# Patient Record
Sex: Male | Born: 1960 | Race: White | Hispanic: No | Marital: Married | Smoking: Never smoker
Health system: Southern US, Community
[De-identification: ages and names within clinical notes are randomized; demographics above are authoritative.]

## PROBLEM LIST (undated history)

## (undated) DIAGNOSIS — I10 Essential (primary) hypertension: Secondary | ICD-10-CM

## (undated) DIAGNOSIS — L409 Psoriasis, unspecified: Secondary | ICD-10-CM

## (undated) DIAGNOSIS — K5790 Diverticulosis of intestine, part unspecified, without perforation or abscess without bleeding: Secondary | ICD-10-CM

## (undated) DIAGNOSIS — E785 Hyperlipidemia, unspecified: Secondary | ICD-10-CM

## (undated) DIAGNOSIS — I350 Nonrheumatic aortic (valve) stenosis: Secondary | ICD-10-CM

## (undated) DIAGNOSIS — Q231 Congenital insufficiency of aortic valve: Secondary | ICD-10-CM

## (undated) DIAGNOSIS — I7789 Other specified disorders of arteries and arterioles: Secondary | ICD-10-CM

## (undated) DIAGNOSIS — D649 Anemia, unspecified: Secondary | ICD-10-CM

## (undated) DIAGNOSIS — G4733 Obstructive sleep apnea (adult) (pediatric): Secondary | ICD-10-CM

## (undated) DIAGNOSIS — J45909 Unspecified asthma, uncomplicated: Secondary | ICD-10-CM

## (undated) DIAGNOSIS — G4730 Sleep apnea, unspecified: Secondary | ICD-10-CM

## (undated) HISTORY — DX: Unspecified asthma, uncomplicated: J45.909

## (undated) HISTORY — DX: Psoriasis, unspecified: L40.9

## (undated) HISTORY — DX: Obstructive sleep apnea (adult) (pediatric): G47.33

## (undated) HISTORY — DX: Congenital insufficiency of aortic valve: Q23.1

## (undated) HISTORY — DX: Sleep apnea, unspecified: G47.30

## (undated) HISTORY — PX: LITHOTRIPSY: SUR834

## (undated) HISTORY — DX: Other specified disorders of arteries and arterioles: I77.89

## (undated) HISTORY — DX: Anemia, unspecified: D64.9

## (undated) HISTORY — DX: Nonrheumatic aortic (valve) stenosis: I35.0

## (undated) HISTORY — PX: HERNIA REPAIR: SHX51

## (undated) HISTORY — DX: Diverticulosis of intestine, part unspecified, without perforation or abscess without bleeding: K57.90

## (undated) HISTORY — DX: Hyperlipidemia, unspecified: E78.5

## (undated) HISTORY — DX: Essential (primary) hypertension: I10

---

## 2003-08-27 HISTORY — PX: THUMB FUSION: SUR636

## 2004-02-10 ENCOUNTER — Ambulatory Visit (HOSPITAL_BASED_OUTPATIENT_CLINIC_OR_DEPARTMENT_OTHER): Admission: RE | Admit: 2004-02-10 | Discharge: 2004-02-10 | Payer: Self-pay | Admitting: Orthopedic Surgery

## 2004-02-10 ENCOUNTER — Ambulatory Visit (HOSPITAL_COMMUNITY): Admission: RE | Admit: 2004-02-10 | Discharge: 2004-02-10 | Payer: Self-pay | Admitting: Orthopedic Surgery

## 2006-08-26 HISTORY — PX: COLONOSCOPY: SHX174

## 2016-04-06 DIAGNOSIS — I10 Essential (primary) hypertension: Secondary | ICD-10-CM

## 2016-04-06 DIAGNOSIS — Q231 Congenital insufficiency of aortic valve: Secondary | ICD-10-CM

## 2016-04-06 DIAGNOSIS — E785 Hyperlipidemia, unspecified: Secondary | ICD-10-CM

## 2016-04-06 DIAGNOSIS — Q2381 Bicuspid aortic valve: Secondary | ICD-10-CM

## 2016-04-06 DIAGNOSIS — I7789 Other specified disorders of arteries and arterioles: Secondary | ICD-10-CM

## 2016-04-06 DIAGNOSIS — I350 Nonrheumatic aortic (valve) stenosis: Secondary | ICD-10-CM | POA: Insufficient documentation

## 2016-04-06 HISTORY — DX: Nonrheumatic aortic (valve) stenosis: I35.0

## 2016-04-06 HISTORY — DX: Bicuspid aortic valve: Q23.81

## 2016-04-06 HISTORY — DX: Other specified disorders of arteries and arterioles: I77.89

## 2016-04-06 HISTORY — DX: Congenital insufficiency of aortic valve: Q23.1

## 2016-04-06 HISTORY — DX: Essential (primary) hypertension: I10

## 2016-04-06 HISTORY — DX: Hyperlipidemia, unspecified: E78.5

## 2016-08-26 HISTORY — PX: AORTIC VALVE SURGERY: SHX549

## 2016-08-26 HISTORY — PX: UPPER GASTROINTESTINAL ENDOSCOPY: SHX188

## 2017-04-03 DIAGNOSIS — G4733 Obstructive sleep apnea (adult) (pediatric): Secondary | ICD-10-CM

## 2017-04-03 DIAGNOSIS — L409 Psoriasis, unspecified: Secondary | ICD-10-CM

## 2017-04-03 DIAGNOSIS — D649 Anemia, unspecified: Secondary | ICD-10-CM

## 2017-04-03 DIAGNOSIS — K5790 Diverticulosis of intestine, part unspecified, without perforation or abscess without bleeding: Secondary | ICD-10-CM

## 2017-04-03 DIAGNOSIS — J45909 Unspecified asthma, uncomplicated: Secondary | ICD-10-CM

## 2017-04-03 HISTORY — DX: Obstructive sleep apnea (adult) (pediatric): G47.33

## 2017-04-03 HISTORY — DX: Unspecified asthma, uncomplicated: J45.909

## 2017-04-03 HISTORY — DX: Diverticulosis of intestine, part unspecified, without perforation or abscess without bleeding: K57.90

## 2017-04-03 HISTORY — DX: Psoriasis, unspecified: L40.9

## 2017-04-03 HISTORY — DX: Anemia, unspecified: D64.9

## 2017-04-07 ENCOUNTER — Ambulatory Visit (INDEPENDENT_AMBULATORY_CARE_PROVIDER_SITE_OTHER): Payer: 59 | Admitting: Cardiology

## 2017-04-07 ENCOUNTER — Encounter: Payer: Self-pay | Admitting: Cardiology

## 2017-04-07 VITALS — BP 140/98 | HR 91 | Ht 73.5 in | Wt 271.0 lb

## 2017-04-07 DIAGNOSIS — Q231 Congenital insufficiency of aortic valve: Secondary | ICD-10-CM | POA: Diagnosis not present

## 2017-04-07 DIAGNOSIS — R079 Chest pain, unspecified: Secondary | ICD-10-CM

## 2017-04-07 DIAGNOSIS — I10 Essential (primary) hypertension: Secondary | ICD-10-CM

## 2017-04-07 DIAGNOSIS — I7789 Other specified disorders of arteries and arterioles: Secondary | ICD-10-CM | POA: Diagnosis not present

## 2017-04-07 MED ORDER — LOSARTAN POTASSIUM 100 MG PO TABS: 100.0000 mg | ORAL_TABLET | Freq: Every day | ORAL | 3 refills | Status: DC

## 2017-04-07 NOTE — Patient Instructions (Signed)
Medication Instructions:  Your physician has recommended you make the following change in your medication:  STOP valsartan START losartan (Cozaar) 100 mg daily   Labwork: None  Testing/Procedures: Your physician has requested that you have an echocardiogram. Echocardiography is a painless test that uses sound waves to create images of your heart. It provides your doctor with information about the size and shape of your heart and how well your heart's chambers and valves are working. This procedure takes approximately one hour. There are no restrictions for this procedure.  Your physician has requested that you have a stress echocardiogram. For further information please visit [email or web address]. Please follow instruction sheet as given.  You had an EKG today.  Follow-Up: Your physician wants you to follow-up in: 6 months. You will receive a reminder letter in the mail two months in advance. If you don't receive a letter, please call our office to schedule the follow-up appointment.   Any Other Special Instructions Will Be Listed Below (If Applicable).     If you need a refill on your cardiac medications before your next appointment, please call your pharmacy.

## 2017-04-07 NOTE — Progress Notes (Signed)
Cardiology Office Note:    Date:  04/07/2017   ID:  [name], [name], [name], MRN [id number]  PCP:  [name], MD  Cardiologist:  Norman Herrlich, MD    Referring MD: [name], MD    ASSESSMENT:    1. Bicuspid aortic valve   2. Enlarged thoracic aorta (HCC)   3. Essential hypertension   4. Chest pain, unspecified type    PLAN:    In order of problems listed above:  1. Stable, symptoms raise concern for rapid progression echocardiogram ordered 2. Stable I don't think he requires a repeat CT scan this year 3. Stable resume his antihypertensives switch to a different ARB 4. Concerning, we discussed potential of invasive or noninvasive evaluation and he will have a stress echo performed in the office.   Next appointment: 6 months   Medication Adjustments/Labs and Tests Ordered: Current medicines are reviewed at length with the patient today.  Concerns regarding medicines are outlined above.  Orders Placed This Encounter  Procedures  . EKG 12-Lead  . ECHOCARDIOGRAM COMPLETE  . ECHOCARDIOGRAM STRESS TEST   Meds ordered this encounter  Medications  . losartan (COZAAR) 100 MG tablet    Sig: Take 1 tablet (100 mg total) by mouth daily. Red=uce to 1/2 for a systolic of < 105    Dispense:  90 tablet    Refill:  3    Chief Complaint  Patient presents with  . Follow-up    1 year flup appt   . Chest Pain  . Shortness of Breath  . Aortic Stenosis    History of Present Illness:    [name] is a 56 y.o. male with a hx of HTN, Valvular heart disease, with mild AS in 2015 and mild aortic root enlargement  last seen one year ago.He had echo and CT of chest one year ago. Recently he's been under increased stress at work and notices and hot humid weather when he walks outside a pill he gets tightness in the chest relieved with rest. It similar symptoms a year ago when he normal ischemia evaluation. Symptoms wax and wane occurring frequently and  recently have been improved. His home blood pressures are variable for a week he had stopped his medications but has resumed. Blood pressure and generalized been between 110 and 140 systolic. No shortness of breath or syncope. Compliance with diet, lifestyle and medications: Yes  Past Medical History:  Diagnosis Date  . Anemia 04/03/2017  . Asthma 04/03/2017  . Bicuspid aortic valve 04/06/2016  . Diverticulosis 04/03/2017  . 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 Essential hypertension 04/06/2016  . Hyperlipidemia 04/06/2016  . Mild aortic stenosis 04/06/2016   Overview:  Mild at last FU 2015  . Obstructive sleep apnea 04/03/2017  . Psoriasis 04/03/2017    Past Surgical History:  Procedure Laterality Date  . HERNIA REPAIR    . LITHOTRIPSY      Current Medications: Current Meds  Medication Sig  . albuterol (PROVENTIL HFA;VENTOLIN HFA) 108 (90 Base) MCG/ACT inhaler Inhale 2 puffs into the lungs every 4 (four) hours as needed for wheezing.  Marland Kitchen aspirin EC 81 MG tablet Take 81 mg by mouth daily.  . montelukast (SINGULAIR) 10 MG tablet Take 10 mg by mouth daily.  . ustekinumab (STELARA) 90 MG/ML SOSY injection 90 mg every 3 (three) months.  . [DISCONTINUED] valsartan (DIOVAN) 80 MG tablet Take 80 mg by mouth daily.  Allergies:   Sulfamethoxazole   Social History   Social History  . Marital status: Married    Spouse name: N/A  . Number of children: N/A  . Years of education: N/A   Social History Main Topics  . Smoking status: Never Smoker  . Smokeless tobacco: Never Used  . Alcohol use No  . Drug use: No  . Sexual activity: Not Asked   Other Topics Concern  . None   Social History Narrative  . None     Family History: The patient's family history includes Cancer in his sister; Diabetes in his father. ROS:   Please see the history of present illness.    All other systems reviewed and are negative.  EKGs/Labs/Other Studies Reviewed:     The following studies were reviewed today:  EKG:  EKG ordered today.  The ekg ordered today demonstrates Sinus rhythm normal Echo 05/02/17: Moderate AS 40/22 mm Hg CTA stable thoracic aorta 43 mm Exercise MPI 05/10/16 Normal at 7 mets1q Recent Labs: No results found for requested labs within last 8760 hours.  Recent Lipid Panel No results found for: CHOL, TRIG, HDL, CHOLHDL, VLDL, LDLCALC, LDLDIRECT  Physical Exam:    VS:  BP (!) 140/98 (BP Location: Left Arm, Patient Position: Sitting)   Pulse 91   Ht 6' 1.5" (1.867 m)   Wt 271 lb (122.9 kg)   SpO2 95%   BMI 35.27 kg/m     Wt Readings from Last 3 Encounters:  04/07/17 271 lb (122.9 kg)     GEN:  Well nourished, well developed in no acute distress HEENT: Normal NECK: No JVD; No carotid bruits LYMPHATICS: No lymphadenopathy CARDIAC: RRR, no murmurs, rubs, gallops RESPIRATORY:  Clear to auscultation without rales, wheezing or rhonchi  ABDOMEN: Soft, non-tender, non-distended MUSCULOSKELETAL:  No edema; No deformity  SKIN: Warm and dry NEUROLOGIC:  Alert and oriented x 3 PSYCHIATRIC:  Normal affect    Signed, Norman HerrlichBrian Munley, MD  04/07/2017 11:54 AM    [facility]

## 2017-04-17 ENCOUNTER — Telehealth: Payer: Self-pay | Admitting: Cardiology

## 2017-04-17 DIAGNOSIS — R079 Chest pain, unspecified: Secondary | ICD-10-CM | POA: Diagnosis not present

## 2017-04-17 NOTE — Telephone Encounter (Signed)
Patient wants you to call.

## 2017-04-17 NOTE — Telephone Encounter (Signed)
Scheduled pt appt for Tuesday but want BJM to give him a "quick buzz" when he gets a chance about the progression of his aortic valve

## 2017-04-22 ENCOUNTER — Ambulatory Visit (INDEPENDENT_AMBULATORY_CARE_PROVIDER_SITE_OTHER): Payer: 59 | Admitting: Cardiology

## 2017-04-22 ENCOUNTER — Ambulatory Visit (HOSPITAL_BASED_OUTPATIENT_CLINIC_OR_DEPARTMENT_OTHER)
Admission: RE | Admit: 2017-04-22 | Discharge: 2017-04-22 | Disposition: A | Discharge status: Home / Self Care | Payer: 59 | Source: Ambulatory Visit | Attending: Cardiology | Admitting: Cardiology

## 2017-04-22 ENCOUNTER — Encounter: Payer: Self-pay | Admitting: Cardiology

## 2017-04-22 VITALS — BP 132/94 | HR 119 | Ht 73.0 in | Wt 265.4 lb

## 2017-04-22 DIAGNOSIS — I350 Nonrheumatic aortic (valve) stenosis: Secondary | ICD-10-CM | POA: Diagnosis not present

## 2017-04-22 DIAGNOSIS — Z01810 Encounter for preprocedural cardiovascular examination: Secondary | ICD-10-CM

## 2017-04-22 DIAGNOSIS — Z01818 Encounter for other preprocedural examination: Secondary | ICD-10-CM | POA: Diagnosis not present

## 2017-04-22 DIAGNOSIS — I700 Atherosclerosis of aorta: Secondary | ICD-10-CM | POA: Insufficient documentation

## 2017-04-22 NOTE — Patient Instructions (Signed)
Medication Instructions:  Your physician recommends that you continue on your current medications as directed. Please refer to the Current Medication list given to you today.   Labwork: Your physician recommends that you return for lab work in: today. CBC, CMP, pt/inr   Testing/Procedures: A chest x-ray takes a picture of the organs and structures inside the chest, including the heart, lungs, and blood vessels. This test can show several things, including, whether the heart is enlarges; whether fluid is building up in the lungs; and whether pacemaker / defibrillator leads are still in place.  Your physician has requested that you have a cardiac catheterization. Cardiac catheterization is used to diagnose and/or treat various heart conditions. Doctors may recommend this procedure for a number of different reasons. The most common reason is to evaluate chest pain. Chest pain can be a symptom of coronary artery disease (CAD), and cardiac catheterization can show whether plaque is narrowing or blocking your heart's arteries. This procedure is also used to evaluate the valves, as well as measure the blood flow and oxygen levels in different parts of your heart. For further information please visit [email or web address] Please follow instruction sheet, as given.   Follow-Up: Your physician recommends that you schedule a follow-up appointment in: 3 weeks.   Any Other Special Instructions Will Be Listed Below (If Applicable).     If you need a refill on your cardiac medications before your next appointment, please call your pharmacy.

## 2017-04-22 NOTE — Progress Notes (Signed)
Cardiology Office Note:    Date:  04/22/2017   ID:  [name], [name] May 27, 1961, MRN [id number]  PCP:  [name], MD  Cardiologist:  Norman Herrlich, MD    Referring MD: [name], MD    ASSESSMENT:    1. Nonrheumatic aortic valve stenosis    PLAN:    In order of problems listed above:  1. Plan to undergo diagnostic left and right heart catheterization and if he is significant valvular stenosis and symptomatic to consider intervention. Despite young age I would hope he be a candidate for tissue AVR. He'll continue his current medical regimen including aspirin antihypertensive. His hypertension is stable.   Next appointment: 2-3 weeks   Medication Adjustments/Labs and Tests Ordered: Current medicines are reviewed at length with the patient today.  Concerns regarding medicines are outlined above.  Orders Placed This Encounter  Procedures  . DG Chest 2 View  . Comprehensive Metabolic Panel (CMET)  . CBC with Differential/Platelet  . Protime-INR   No orders of the defined types were placed in this encounter.   Chief Complaint  Patient presents with  . Follow-up    flup after echo   . Aortic Stenosis    History of Present Illness:    [name] is a 56 y.o. male with a hx of hypertension, bicuspid AV previous mild to moderate AS and mild aoric root enlargement seen recently for exertional SOB and chest tightness.  Echocardiogram 2015 showed mild stenosis echocardiogram October 2017 showed that he had just made the transition to moderate. Echocardiogram done last week because of exertional shortness of breath and chest tightness shows moderate to severe aortic stenosis. I reviewed with the patient is wife this is a very uncommon progression he does not smoke and does not had radiation therapy. He is very apprehensive and advised him undergo both left and right heart catheterization to exclude associated CAD, he had a normal myocardial perfusion study  in October 2017 and to define his aortic stenosis and whether he needs to be considered for intervention. Options risk and benefits detailed the patient and wife agree. I've asked him to hold off on vigorous physical activity and exercise pending his coronary angiogram. He has a wedding scheduled 2 weeks and I told him I think he could attend. Compliance with diet, lifestyle and medications: Yes Past Medical History:  Diagnosis Date  . Anemia 04/03/2017  . Asthma 04/03/2017  . Bicuspid aortic valve 04/06/2016  . Diverticulosis 04/03/2017  . 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 Essential hypertension 04/06/2016  . Hyperlipidemia 04/06/2016  . Mild aortic stenosis 04/06/2016   Overview:  Mild at last FU 2015  . Obstructive sleep apnea 04/03/2017  . Psoriasis 04/03/2017    Past Surgical History:  Procedure Laterality Date  . HERNIA REPAIR    . LITHOTRIPSY      Current Medications: No outpatient prescriptions have been marked as taking for the 04/22/17 encounter (Office Visit) with Baldo Daub, MD.     Allergies:   Sulfamethoxazole   Social History   Social History  . Marital status: Married    Spouse name: N/A  . Number of children: N/A  . Years of education: N/A   Social History Main Topics  . Smoking status: Never Smoker  . Smokeless tobacco: Never Used  . Alcohol use No  . Drug use: No  . Sexual activity: Not Asked   Other Topics  Concern  . None   Social History Narrative  . None     Family History: The patient's family history includes Cancer in his sister; Diabetes in his father. ROS:   Please see the history of present illness.    All other systems reviewed and are negative.  EKGs/Labs/Other Studies Reviewed:    The following studies were reviewed today:   Echo 04/17/17:  showd worsened AS P/M 61/37 mm hg with AVA 0.8cm2 Recent Labs: No results found for requested labs within last 8760 hours.  Recent Lipid Panel No  results found for: CHOL, TRIG, HDL, CHOLHDL, VLDL, LDLCALC, LDLDIRECT  Physical Exam:    VS:  BP (!) 132/94 (BP Location: Right Arm, Patient Position: Sitting)   Pulse (!) 119   Ht 6\' 1"  (1.854 m)   Wt 265 lb 6.4 oz (120.4 kg)   SpO2 96%   BMI 35.02 kg/m     Wt Readings from Last 3 Encounters:  04/22/17 265 lb 6.4 oz (120.4 kg)  04/07/17 271 lb (122.9 kg)     GEN:  Well nourished, well developed in no acute distress HEENT: Normal NECK: No JVD; No carotid bruits LYMPHATICS: No lymphadenopathy CARDIAC: He has a grade 2 to 3/6 systolic ejection murmur peaks late S2 single radiates up into the right clavicular area RRR,  rubs, gallops RESPIRATORY:  Clear to auscultation without rales, wheezing or rhonchi  ABDOMEN: Soft, non-tender, non-distended MUSCULOSKELETAL:  No edema; No deformity  SKIN: Warm and dry NEUROLOGIC:  Alert and oriented x 3 PSYCHIATRIC:  Normal affect    Signed, Norman HerrlichBrian Munley, MD  04/22/2017 11:03 AM    [facility]

## 2017-04-23 ENCOUNTER — Telehealth: Payer: Self-pay

## 2017-04-23 LAB — COMPREHENSIVE METABOLIC PANEL
ALBUMIN: 4.8 g/dL (ref 3.5–5.5)
ALK PHOS: 51 IU/L (ref 39–117)
ALT: 33 IU/L (ref 0–44)
AST: 23 IU/L (ref 0–40)
Albumin/Globulin Ratio: 1.7 (ref 1.2–2.2)
BUN / CREAT RATIO: 18 (ref 9–20)
BUN: 19 mg/dL (ref 6–24)
Bilirubin Total: 0.7 mg/dL (ref 0.0–1.2)
CALCIUM: 10.2 mg/dL (ref 8.7–10.2)
CO2: 20 mmol/L (ref 20–29)
CREATININE: 1.04 mg/dL (ref 0.76–1.27)
Chloride: 104 mmol/L (ref 96–106)
GFR calc non Af Amer: 81 mL/min/{1.73_m2} (ref >59)
GFR, EST AFRICAN AMERICAN: 94 mL/min/{1.73_m2} (ref >59)
GLUCOSE: 81 mg/dL (ref 65–99)
Globulin, Total: 2.8 g/dL (ref 1.5–4.5)
Potassium: 4.3 mmol/L (ref 3.5–5.2)
Sodium: 139 mmol/L (ref 134–144)
TOTAL PROTEIN: 7.6 g/dL (ref 6.0–8.5)

## 2017-04-23 LAB — CBC WITH DIFFERENTIAL/PLATELET
Basophils Absolute: 0 10*3/uL (ref 0.0–0.2)
Basos: 1 %
EOS (ABSOLUTE): 0.1 10*3/uL (ref 0.0–0.4)
EOS: 1 %
HEMATOCRIT: 48.1 % (ref 37.5–51.0)
HEMOGLOBIN: 17.4 g/dL (ref 13.0–17.7)
Immature Grans (Abs): 0 10*3/uL (ref 0.0–0.1)
Immature Granulocytes: 0 %
LYMPHS ABS: 1.5 10*3/uL (ref 0.7–3.1)
Lymphs: 22 %
MCH: 33.4 pg — ABNORMAL HIGH (ref 26.6–33.0)
MCHC: 36.2 g/dL — AB (ref 31.5–35.7)
MCV: 92 fL (ref 79–97)
MONOCYTES: 14 %
Monocytes Absolute: 0.9 10*3/uL (ref 0.1–0.9)
Neutrophils Absolute: 4.1 10*3/uL (ref 1.4–7.0)
Neutrophils: 62 %
Platelets: 214 10*3/uL (ref 150–379)
RBC: 5.21 x10E6/uL (ref 4.14–5.80)
RDW: 14.6 % (ref 12.3–15.4)
WBC: 6.6 10*3/uL (ref 3.4–10.8)

## 2017-04-23 LAB — PROTIME-INR
INR: 1.1 (ref 0.8–1.2)
PROTHROMBIN TIME: 11.1 s (ref 9.1–12.0)

## 2017-04-23 NOTE — Telephone Encounter (Signed)
[facility] for:  04/24/2017 @ 0730 Verified arrival time and place:  NT @ 0530 Confirmed AM meds to be taken pre-cath with sip of water:   Take ASA Confirmed patient has responsible person to drive home post procedure and observe patient for 24 hours:  Yes Addl concerns:  All questions answered to Pt satisfaction.

## 2017-04-24 ENCOUNTER — Encounter (HOSPITAL_COMMUNITY)
Admission: RE | Disposition: A | Discharge status: Home / Self Care | Payer: Self-pay | Source: Ambulatory Visit | Attending: Interventional Cardiology

## 2017-04-24 ENCOUNTER — Ambulatory Visit (HOSPITAL_COMMUNITY)
Admission: RE | Admit: 2017-04-24 | Discharge: 2017-04-24 | Disposition: A | Discharge status: Home / Self Care | Payer: 59 | Source: Ambulatory Visit | Attending: Interventional Cardiology | Admitting: Interventional Cardiology

## 2017-04-24 ENCOUNTER — Encounter (HOSPITAL_COMMUNITY): Payer: Self-pay | Admitting: Interventional Cardiology

## 2017-04-24 DIAGNOSIS — I352 Nonrheumatic aortic (valve) stenosis with insufficiency: Secondary | ICD-10-CM | POA: Diagnosis not present

## 2017-04-24 DIAGNOSIS — D649 Anemia, unspecified: Secondary | ICD-10-CM | POA: Diagnosis present

## 2017-04-24 DIAGNOSIS — Q231 Congenital insufficiency of aortic valve: Secondary | ICD-10-CM | POA: Diagnosis present

## 2017-04-24 DIAGNOSIS — I7789 Other specified disorders of arteries and arterioles: Secondary | ICD-10-CM | POA: Diagnosis present

## 2017-04-24 DIAGNOSIS — G4733 Obstructive sleep apnea (adult) (pediatric): Secondary | ICD-10-CM | POA: Insufficient documentation

## 2017-04-24 DIAGNOSIS — I10 Essential (primary) hypertension: Secondary | ICD-10-CM | POA: Diagnosis present

## 2017-04-24 DIAGNOSIS — J45909 Unspecified asthma, uncomplicated: Secondary | ICD-10-CM | POA: Insufficient documentation

## 2017-04-24 DIAGNOSIS — E785 Hyperlipidemia, unspecified: Secondary | ICD-10-CM | POA: Diagnosis present

## 2017-04-24 DIAGNOSIS — I2510 Atherosclerotic heart disease of native coronary artery without angina pectoris: Secondary | ICD-10-CM

## 2017-04-24 DIAGNOSIS — I350 Nonrheumatic aortic (valve) stenosis: Secondary | ICD-10-CM | POA: Diagnosis not present

## 2017-04-24 DIAGNOSIS — Q2381 Bicuspid aortic valve: Secondary | ICD-10-CM

## 2017-04-24 HISTORY — PX: RIGHT/LEFT HEART CATH AND CORONARY ANGIOGRAPHY: CATH118266

## 2017-04-24 LAB — POCT I-STAT 3, ART BLOOD GAS (G3+)
BICARBONATE: 25.2 mmol/L (ref 20.0–28.0)
O2 Saturation: 95 %
PH ART: 7.404 (ref 7.350–7.450)
TCO2: 26 mmol/L (ref 22–32)
pCO2 arterial: 40.2 mmHg (ref 32.0–48.0)
pO2, Arterial: 74 mmHg — ABNORMAL LOW (ref 83.0–108.0)

## 2017-04-24 LAB — POCT I-STAT 3, VENOUS BLOOD GAS (G3P V)
Acid-Base Excess: 1 mmol/L (ref 0.0–2.0)
Bicarbonate: 26.1 mmol/L (ref 20.0–28.0)
O2 SAT: 68 %
PCO2 VEN: 43.8 mmHg — AB (ref 44.0–60.0)
PO2 VEN: 36 mmHg (ref 32.0–45.0)
TCO2: 27 mmol/L (ref 22–32)
pH, Ven: 7.383 (ref 7.250–7.430)

## 2017-04-24 SURGERY — RIGHT/LEFT HEART CATH AND CORONARY ANGIOGRAPHY
Anesthesia: LOCAL

## 2017-04-24 MED ORDER — FENTANYL CITRATE (PF) 100 MCG/2ML IJ SOLN
INTRAMUSCULAR | Status: DC | PRN
  Administered 2017-04-24 (×2): 50 ug via INTRAVENOUS

## 2017-04-24 MED ORDER — HEPARIN (PORCINE) IN NACL 2-0.9 UNIT/ML-% IJ SOLN
INTRAMUSCULAR | Status: AC | PRN
  Administered 2017-04-24: 1000 mL

## 2017-04-24 MED ORDER — ONDANSETRON HCL 4 MG/2ML IJ SOLN: 4.0000 mg | Freq: Four times a day (QID) | INTRAMUSCULAR | Status: DC | PRN

## 2017-04-24 MED ORDER — LIDOCAINE HCL 2 % IJ SOLN
INTRAMUSCULAR | Status: AC
  Filled 2017-04-24: qty 10

## 2017-04-24 MED ORDER — ASPIRIN 81 MG PO CHEW: 81.0000 mg | CHEWABLE_TABLET | ORAL | Status: AC

## 2017-04-24 MED ORDER — VERAPAMIL HCL 2.5 MG/ML IV SOLN
INTRAVENOUS | Status: AC
Start: 2017-04-24 — End: 2017-04-24
  Filled 2017-04-24: qty 2

## 2017-04-24 MED ORDER — SODIUM CHLORIDE 0.9 % WEIGHT BASED INFUSION: 1.0000 mL/kg/h | INTRAVENOUS | Status: DC

## 2017-04-24 MED ORDER — SODIUM CHLORIDE 0.9 % IV SOLN: INTRAVENOUS | Status: DC

## 2017-04-24 MED ORDER — LIDOCAINE HCL (PF) 1 % IJ SOLN
INTRAMUSCULAR | Status: DC | PRN
  Administered 2017-04-24 (×2): 1 mL via INTRADERMAL

## 2017-04-24 MED ORDER — OXYCODONE HCL 5 MG PO TABS: 5.0000 mg | ORAL_TABLET | ORAL | Status: DC | PRN

## 2017-04-24 MED ORDER — MIDAZOLAM HCL 2 MG/2ML IJ SOLN
INTRAMUSCULAR | Status: DC | PRN
  Administered 2017-04-24 (×3): 1 mg via INTRAVENOUS

## 2017-04-24 MED ORDER — SODIUM CHLORIDE 0.9 % IV SOLN: 250.0000 mL | INTRAVENOUS | Status: DC | PRN

## 2017-04-24 MED ORDER — HEPARIN SODIUM (PORCINE) 1000 UNIT/ML IJ SOLN
INTRAMUSCULAR | Status: DC | PRN
  Administered 2017-04-24: 6000 [IU] via INTRAVENOUS

## 2017-04-24 MED ORDER — IOPAMIDOL (ISOVUE-370) INJECTION 76%
INTRAVENOUS | Status: DC | PRN
  Administered 2017-04-24: 75 mL via INTRA_ARTERIAL

## 2017-04-24 MED ORDER — ACETAMINOPHEN 325 MG PO TABS: 650.0000 mg | ORAL_TABLET | ORAL | Status: DC | PRN

## 2017-04-24 MED ORDER — ASPIRIN 81 MG PO CHEW: 81.0000 mg | CHEWABLE_TABLET | Freq: Every day | ORAL | Status: DC

## 2017-04-24 MED ORDER — HEPARIN SODIUM (PORCINE) 1000 UNIT/ML IJ SOLN
INTRAMUSCULAR | Status: AC
  Filled 2017-04-24: qty 1

## 2017-04-24 MED ORDER — MIDAZOLAM HCL 2 MG/2ML IJ SOLN
INTRAMUSCULAR | Status: AC
  Filled 2017-04-24: qty 2

## 2017-04-24 MED ORDER — SODIUM CHLORIDE 0.9 % WEIGHT BASED INFUSION
3.0000 mL/kg/h | INTRAVENOUS | Status: AC
  Administered 2017-04-24: 3 mL/kg/h via INTRAVENOUS

## 2017-04-24 MED ORDER — IOPAMIDOL (ISOVUE-370) INJECTION 76%
INTRAVENOUS | Status: AC
  Filled 2017-04-24: qty 100

## 2017-04-24 MED ORDER — HEPARIN (PORCINE) IN NACL 2-0.9 UNIT/ML-% IJ SOLN
INTRAMUSCULAR | Status: AC
  Filled 2017-04-24: qty 1000

## 2017-04-24 MED ORDER — SODIUM CHLORIDE 0.9% FLUSH: 3.0000 mL | Freq: Two times a day (BID) | INTRAVENOUS | Status: DC

## 2017-04-24 MED ORDER — FENTANYL CITRATE (PF) 100 MCG/2ML IJ SOLN
INTRAMUSCULAR | Status: AC
  Filled 2017-04-24: qty 2

## 2017-04-24 MED ORDER — SODIUM CHLORIDE 0.9% FLUSH: 3.0000 mL | INTRAVENOUS | Status: DC | PRN

## 2017-04-24 MED ORDER — VERAPAMIL HCL 2.5 MG/ML IV SOLN
INTRAVENOUS | Status: DC | PRN
  Administered 2017-04-24: 10 mL via INTRA_ARTERIAL

## 2017-04-24 SURGICAL SUPPLY — 16 items
CATH BALLN WEDGE 5F 110CM (CATHETERS) ×2 IMPLANT
CATH EXPO 5FR FR4 (CATHETERS) ×2 IMPLANT
CATH INFINITI 5 FR JL3.5 (CATHETERS) ×2 IMPLANT
COVER PRB 48X5XTLSCP FOLD TPE (BAG) ×1 IMPLANT
COVER PROBE 5X48 (BAG) ×2
DEVICE RAD COMP TR BAND LRG (VASCULAR PRODUCTS) ×2 IMPLANT
GLIDESHEATH SLEND A-KIT 6F 22G (SHEATH) ×2 IMPLANT
GUIDEWIRE .025 260CM (WIRE) ×2 IMPLANT
GUIDEWIRE INQWIRE 1.5J.035X260 (WIRE) ×2 IMPLANT
INQWIRE 1.5J .035X260CM (WIRE) ×4
KIT HEART LEFT (KITS) ×2 IMPLANT
PACK CARDIAC CATHETERIZATION (CUSTOM PROCEDURE TRAY) ×2 IMPLANT
SHEATH GLIDE SLENDER 4/5FR (SHEATH) ×2 IMPLANT
TRANSDUCER W/STOPCOCK (MISCELLANEOUS) ×2 IMPLANT
TUBING CIL FLEX 10 FLL-RA (TUBING) ×2 IMPLANT
WIRE EMERALD ST .035X150CM (WIRE) ×2 IMPLANT

## 2017-04-24 NOTE — Discharge Instructions (Signed)

## 2017-04-24 NOTE — Progress Notes (Signed)
Site area: right radial  Site Prior to Removal:  Level 0  Pressure Applied For 10 MINUTES    Minutes Beginning at 0905  Manual:   yes  Patient Status During Pull:good  Post sheath pull: 0  Post Pull Instructions Given: Yes  Post Pull Pulses Present: yes  Dressing Applied: yes pressure dressing with Coban  Comments:  Tolerated well

## 2017-04-24 NOTE — H&P (View-Only) (Signed)
Cardiology Office Note:    Date:  04/22/2017   ID:  [name], DOB 11/08/1960, MRN [id number]  PCP:  Robbins, Robert A, MD  Cardiologist:  Brian Munley, MD    Referring MD: Robbins, Robert A, MD    ASSESSMENT:    1. Nonrheumatic aortic valve stenosis    PLAN:    In order of problems listed above:  1. Plan to undergo diagnostic left and right heart catheterization and if he is significant valvular stenosis and symptomatic to consider intervention. Despite young age I would hope he be a candidate for tissue AVR. He'll continue his current medical regimen including aspirin antihypertensive. His hypertension is stable.   Next appointment: 2-3 weeks   Medication Adjustments/Labs and Tests Ordered: Current medicines are reviewed at length with the patient today.  Concerns regarding medicines are outlined above.  Orders Placed This Encounter  Procedures  . DG Chest 2 View  . Comprehensive Metabolic Panel (CMET)  . CBC with Differential/Platelet  . Protime-INR   No orders of the defined types were placed in this encounter.   Chief Complaint  Patient presents with  . Follow-up    flup after echo   . Aortic Stenosis    History of Present Illness:    [name] is a 56 y.o. male with a hx of hypertension, bicuspid AV previous mild to moderate AS and mild aoric root enlargement seen recently for exertional SOB and chest tightness.  Echocardiogram 2015 showed mild stenosis echocardiogram October 2017 showed that he had just made the transition to moderate. Echocardiogram done last week because of exertional shortness of breath and chest tightness shows moderate to severe aortic stenosis. I reviewed with the patient is wife this is a very uncommon progression he does not smoke and does not had radiation therapy. He is very apprehensive and advised him undergo both left and right heart catheterization to exclude associated CAD, he had a normal myocardial perfusion study  in October 2017 and to define his aortic stenosis and whether he needs to be considered for intervention. Options risk and benefits detailed the patient and wife agree. I've asked him to hold off on vigorous physical activity and exercise pending his coronary angiogram. He has a wedding scheduled 2 weeks and I told him I think he could attend. Compliance with diet, lifestyle and medications: Yes Past Medical History:  Diagnosis Date  . Anemia 04/03/2017  . Asthma 04/03/2017  . Bicuspid aortic valve 04/06/2016  . Diverticulosis 04/03/2017  . 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 Essential hypertension 04/06/2016  . Hyperlipidemia 04/06/2016  . Mild aortic stenosis 04/06/2016   Overview:  Mild at last FU 2015  . Obstructive sleep apnea 04/03/2017  . Psoriasis 04/03/2017    Past Surgical History:  Procedure Laterality Date  . HERNIA REPAIR    . LITHOTRIPSY      Current Medications: No outpatient prescriptions have been marked as taking for the 04/22/17 encounter (Office Visit) with Munley, Brian J, MD.     Allergies:   Sulfamethoxazole   Social History   Social History  . Marital status: Married    Spouse name: N/A  . Number of children: N/A  . Years of education: N/A   Social History Main Topics  . Smoking status: Never Smoker  . Smokeless tobacco: Never Used  . Alcohol use No  . Drug use: No  . Sexual activity: Not Asked   Other Topics   Concern  . None   Social History Narrative  . None     Family History: The patient's family history includes Cancer in his sister; Diabetes in his father. ROS:   Please see the history of present illness.    All other systems reviewed and are negative.  EKGs/Labs/Other Studies Reviewed:    The following studies were reviewed today:   Echo 04/17/17:  showd worsened AS P/M 61/37 mm hg with AVA 0.8cm2 Recent Labs: No results found for requested labs within last 8760 hours.  Recent Lipid Panel No  results found for: CHOL, TRIG, HDL, CHOLHDL, VLDL, LDLCALC, LDLDIRECT  Physical Exam:    VS:  BP (!) 132/94 (BP Location: Right Arm, Patient Position: Sitting)   Pulse (!) 119   Ht 6\' 1"  (1.854 m)   Wt 265 lb 6.4 oz (120.4 kg)   SpO2 96%   BMI 35.02 kg/m     Wt Readings from Last 3 Encounters:  04/22/17 265 lb 6.4 oz (120.4 kg)  04/07/17 271 lb (122.9 kg)     GEN:  Well nourished, well developed in no acute distress HEENT: Normal NECK: No JVD; No carotid bruits LYMPHATICS: No lymphadenopathy CARDIAC: He has a grade 2 to 3/6 systolic ejection murmur peaks late S2 single radiates up into the right clavicular area RRR,  rubs, gallops RESPIRATORY:  Clear to auscultation without rales, wheezing or rhonchi  ABDOMEN: Soft, non-tender, non-distended MUSCULOSKELETAL:  No edema; No deformity  SKIN: Warm and dry NEUROLOGIC:  Alert and oriented x 3 PSYCHIATRIC:  Normal affect    Signed, Norman HerrlichBrian Munley, MD  04/22/2017 11:03 AM    [facility]

## 2017-04-24 NOTE — Interval H&P Note (Signed)
Cath Lab Visit (complete for each Cath Lab visit)  Clinical Evaluation Leading to the Procedure:   ACS: No.  Non-ACS:    Anginal Classification: CCS III  Anti-ischemic medical therapy: Minimal Therapy (1 class of medications)  Non-Invasive Test Results: No non-invasive testing performed  Prior CABG: No previous CABG      History and Physical Interval Note:  04/24/2017 7:16 AM  [name]  has presented today for surgery, with the diagnosis of aortic stenosis  The various methods of treatment have been discussed with the patient and family. After consideration of risks, benefits and other options for treatment, the patient has consented to  Procedure(s): RIGHT/LEFT HEART CATH AND CORONARY ANGIOGRAPHY (N/A) as a surgical intervention .  The patient's history has been reviewed, patient examined, no change in status, stable for surgery.  I have reviewed the patient's chart and labs.  Questions were answered to the patient's satisfaction.     Lyn RecordsHenry W Smith III

## 2017-05-07 ENCOUNTER — Other Ambulatory Visit (HOSPITAL_COMMUNITY): Payer: 59

## 2017-05-12 NOTE — Progress Notes (Signed)
Cardiology Office Note:    Date:  05/13/2017   ID:  [name], DOB 07/11/1961, MRN [id number]  PCP:  [name], MD  Cardiologist:  Norman Herrlich, MD    Referring MD: [name], MD    ASSESSMENT:    1. Aortic stenosis due to bicuspid aortic valve    PLAN:    In order of problems listed above:  1. He is referred to CT surgery for consultation and discussion of surgical AVR. He'll make a well visit with his dentist. He was given a prescription of Ativan to use when necessary with anxiety. With this enlargement of thoracic aorta he'll require a preoperative CT   Next appointment: after surgical procedure   Medication Adjustments/Labs and Tests Ordered: Current medicines are reviewed at length with the patient today.  Concerns regarding medicines are outlined above.  Orders Placed This Encounter  Procedures  . Ambulatory referral to Cardiovascular Surgery   Meds ordered this encounter  Medications  . LORazepam (ATIVAN) 1 MG tablet    Sig: Take 1 tablet (1 mg total) by mouth 2 (two) times daily as needed for anxiety.    Dispense:  30 tablet    Refill:  0    Chief Complaint  Patient presents with  . Follow-up    3 week flup after RHC and LHC    History of Present Illness:    [name] is a 56 y.o. male with a hx of  hypertension, bicuspid AV previous mild to moderate AS and mild aoric root enlargement  last seen 04/22/17 and recent cath confirms severe AS.he had a recent wedding of his child felt well and was vigorous and active.He is quite anxious and apprehensive about upcoming aortic valve surgery plans to see CT surgery as an outpatient and is considering a second opinion. I gave him a prescription for Ativan to use as needed for anxiety and maze referral for surgical consultation. I asked him  To schedule with his dentist. He is concerned about choice of mechanical versus tissue valve. Compliance with diet, lifestyle and medications:  yes Past Medical History:  Diagnosis Date  . Anemia 04/03/2017  . Asthma 04/03/2017  . Bicuspid aortic valve 04/06/2016  . Diverticulosis 04/03/2017  . 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 Essential hypertension 04/06/2016  . Hyperlipidemia 04/06/2016  . Mild aortic stenosis 04/06/2016   Overview:  Mild at last FU 2015  . Obstructive sleep apnea 04/03/2017  . Psoriasis 04/03/2017    Past Surgical History:  Procedure Laterality Date  . HERNIA REPAIR    . LITHOTRIPSY    . RIGHT/LEFT HEART CATH AND CORONARY ANGIOGRAPHY N/A 04/24/2017   Procedure: RIGHT/LEFT HEART CATH AND CORONARY ANGIOGRAPHY;  Surgeon: Lyn Records, MD;  Location: [facility];  Service: Cardiovascular;  Laterality: N/A;    Current Medications: Current Meds  Medication Sig  . albuterol (PROVENTIL HFA;VENTOLIN HFA) 108 (90 Base) MCG/ACT inhaler Inhale 2 puffs into the lungs every 4 (four) hours as needed for wheezing.  Marland Kitchen aspirin EC 81 MG tablet Take 81 mg by mouth daily.  . cholecalciferol (VITAMIN D) 1000 units tablet Take 2,000 Units by mouth daily.   Marland Kitchen losartan (COZAAR) 100 MG tablet Take 1 tablet (100 mg total) by mouth daily. Red=uce to 1/2 for a systolic of < 105  . montelukast (SINGULAIR) 10 MG tablet Take 10 mg by mouth daily as needed (allergies).   Marland Kitchen  Multiple Vitamin (MULTIVITAMIN WITH MINERALS) TABS tablet Take 1 tablet by mouth daily.  . ustekinumab (STELARA) 90 MG/ML SOSY injection Inject 90 mg into the skin every 3 (three) months.      Allergies:   Sulfamethoxazole   Social History   Social History  . Marital status: Married    Spouse name: N/A  . Number of children: N/A  . Years of education: N/A   Social History Main Topics  . Smoking status: Never Smoker  . Smokeless tobacco: Never Used  . Alcohol use No  . Drug use: No  . Sexual activity: Not on file   Other Topics Concern  . Not on file   Social History Narrative  . No narrative on file      Family History: The patient's family history includes Cancer in his sister; Diabetes in his father. ROS:   Please see the history of present illness.    All other systems reviewed and are negative.  EKGs/Labs/Other Studies Reviewed:    The following studies were reviewed today: Right/left heart cath: Conclusion    Moderately severe to severe aortic stenosis, peak to peak gradient 58 mmHg, mean gradient 43 mmHg. Heavily calcified aortic valve with decreased leaflet motion.  Right dominant coronary anatomy. 30-40% proximal RCA eccentric narrowing.  Normal left main .  Normal circumflex.  Normal ramus intermedius.  Normal right heart pressures.    Recent Labs: 04/22/2017: ALT 33; BUN 19; Creatinine, Ser 1.04; Hemoglobin 17.4; Platelets 214; Potassium 4.3; Sodium 139  Recent Lipid Panel No results found for: CHOL, TRIG, HDL, CHOLHDL, VLDL, LDLCALC, LDLDIRECT  Physical Exam:    VS:  BP 122/88 (BP Location: Right Arm, Patient Position: Sitting)   Pulse (!) 129   Ht  (1.854 m)   Wt 264 lb 1.9 oz (119.8 kg)   SpO2 96%   BMI 34.85 kg/m     Wt Readings from Last 3 Encounters:  05/13/17 264 lb 1.9 oz (119.8 kg)  04/24/17 265 lb (120.2 kg)  04/22/17 265 lb 6.4 oz (120.4 kg)     GEN:  Well nourished, well developed in no acute distress HEENT: Normal NECK: No JVD; No carotid bruits LYMPHATICS: No lymphadenopathy CARDIAC: 3-46 AF's to the aortic area to the right clavicle to the carotids S2 is singleRRR,  RESPIRATORY:  Clear to auscultation without rales, wheezing or rhonchi  ABDOMEN: Soft, non-tender, non-distended MUSCULOSKELETAL:  No edema; No deformity  SKIN: Warm and dry NEUROLOGIC:  Alert and oriented x 3 PSYCHIATRIC:  Normal affect    Signed, Norman Herrlich, MD  05/13/2017 4:11 PM    [facility]

## 2017-05-13 ENCOUNTER — Ambulatory Visit (INDEPENDENT_AMBULATORY_CARE_PROVIDER_SITE_OTHER): Payer: BC Managed Care – PPO | Admitting: Cardiology

## 2017-05-13 VITALS — BP 122/88 | HR 129 | Ht 73.0 in | Wt 264.1 lb

## 2017-05-13 DIAGNOSIS — Q230 Congenital stenosis of aortic valve: Secondary | ICD-10-CM

## 2017-05-13 DIAGNOSIS — Q231 Congenital insufficiency of aortic valve: Secondary | ICD-10-CM

## 2017-05-13 MED ORDER — LORAZEPAM 1 MG PO TABS: 1.0000 mg | ORAL_TABLET | Freq: Two times a day (BID) | ORAL | 0 refills | Status: DC | PRN

## 2017-05-13 NOTE — Patient Instructions (Signed)
Medication Instructions:  Your physician has recommended you make the following change in your medication:  START lorazepam (Ativan) 1 mg twice daily as needed for anxiety.  Labwork: None  Testing/Procedures: None  Follow-Up: Your physician recommends that you schedule a follow-up appointment after your surgery. Call this office to schedule.  You will receive a phone call to schedule with cardiovascular surgery, Dr. Cornelius Moras.  Any Other Special Instructions Will Be Listed Below (If Applicable).     If you need a refill on your cardiac medications before your next appointment, please call your pharmacy.

## 2017-05-14 ENCOUNTER — Telehealth: Payer: Self-pay | Admitting: Cardiology

## 2017-05-14 NOTE — Telephone Encounter (Signed)
Patient coming tomorrow to sign release.

## 2017-05-14 NOTE — Telephone Encounter (Signed)
Dr. Dulce Sellar referred patient to [facility] for 2nd opinion and [facility] needs medical records and CD of testing, please call patient.

## 2017-05-14 NOTE — Telephone Encounter (Signed)
Left message to return call regarding medical release of information needing to be signed.

## 2017-05-14 NOTE — Telephone Encounter (Signed)
Second opinion: Dr Silvestre Mesi [facility] [place] [facility]  I would see Dr Cornelius Moras first before the second one

## 2017-05-14 NOTE — Telephone Encounter (Signed)
Which [facility] physician did you recommend?

## 2017-05-15 ENCOUNTER — Other Ambulatory Visit: Payer: Self-pay

## 2017-05-15 DIAGNOSIS — Q230 Congenital stenosis of aortic valve: Secondary | ICD-10-CM

## 2017-05-15 DIAGNOSIS — Q231 Congenital insufficiency of aortic valve: Principal | ICD-10-CM

## 2017-05-15 MED ORDER — LORAZEPAM 1 MG PO TABS: 1.0000 mg | ORAL_TABLET | Freq: Two times a day (BID) | ORAL | 0 refills | Status: DC | PRN

## 2017-05-15 NOTE — Telephone Encounter (Signed)
Release signed. Faxed to medical records.

## 2017-05-27 ENCOUNTER — Telehealth: Payer: Self-pay | Admitting: Cardiology

## 2017-05-27 NOTE — Telephone Encounter (Signed)
Spoke with pt and made him aware the requests for Cath CD had been sent to Medical Records, but there is a waiting period for records to be sent.  Pt states that he will contact Medial Records for [facility] and [facility] to try and retrieve CD on his own.  Pt advised to contact our office with any other issues.  Pt verbalized understanding.

## 2017-05-27 NOTE — Telephone Encounter (Signed)
States a CD was supposed to be sent to [facility] but they have not received it

## 2017-05-29 DIAGNOSIS — I7121 Aneurysm of the ascending aorta, without rupture: Secondary | ICD-10-CM | POA: Insufficient documentation

## 2017-05-29 HISTORY — DX: Aneurysm of the ascending aorta, without rupture: I71.21

## 2017-05-30 ENCOUNTER — Telehealth: Payer: Self-pay | Admitting: Cardiology

## 2017-05-30 NOTE — Telephone Encounter (Signed)
Pt notified he can pick up copy of CATH cd today.  Pt verbalized understanding.

## 2017-05-30 NOTE — Telephone Encounter (Signed)
Wants to know if you got his disc yet so he can come by and pick it up

## 2017-06-02 ENCOUNTER — Institutional Professional Consult (permissible substitution) (INDEPENDENT_AMBULATORY_CARE_PROVIDER_SITE_OTHER): Payer: BC Managed Care – PPO | Admitting: Thoracic Surgery (Cardiothoracic Vascular Surgery)

## 2017-06-02 ENCOUNTER — Encounter: Payer: Self-pay | Admitting: Thoracic Surgery (Cardiothoracic Vascular Surgery)

## 2017-06-02 VITALS — BP 140/98 | HR 103 | Ht 73.0 in | Wt 262.0 lb

## 2017-06-02 DIAGNOSIS — I7789 Other specified disorders of arteries and arterioles: Secondary | ICD-10-CM | POA: Diagnosis not present

## 2017-06-02 DIAGNOSIS — I350 Nonrheumatic aortic (valve) stenosis: Secondary | ICD-10-CM | POA: Diagnosis not present

## 2017-06-02 NOTE — Progress Notes (Addendum)
[address]       [place] [address]             [phone]     CARDIOTHORACIC SURGERY CONSULTATION REPORT  Referring Provider is Baldo Daub, MD PCP is [name], MD  Chief Complaint  Patient presents with  . Aortic Stenosis    Cath 04/24/2017    HPI:  Patient is a 56 year old moderately obese male referred for surgical consultation to discuss treatment options for management of likely bicuspid aortic valve with severe symptomatic aortic stenosis. The patient states that he was first noted to have a heart murmur on physical exam in 2008 when he presented with a GI bleed requiring transfusion. He underwent upper and lower endoscopy at that time without finding a clear source for GI blood loss.  Transthoracic echocardiogram performed at that time demonstrated the presence of likely functionally bicuspid aortic valve with mild-to-moderate aortic stenosis. He has been followed intermittently ever since by Dr. Dulce Sellar. Over the last few months the patient has developed symptoms of exertional shortness of breath, decreased energy, and occasional chest tightness. Symptoms have progressed recently with occasional dizzy spells without syncope. Follow-up echocardiogram performed recently at [facility] revealed severe aortic stenosis with preserved left ventricular systolic function. There was also report of dilitation of the aortic root to 3.9 cm, and report from chest CT scan performed in 2017 revealed enlargement of the ascending aorta with maximum transverse diameter 4.3 cm.  Diagnostic cardiac catheterization was performed, confirming the presence of severe aortic stenosis. There was mild nonobstructive coronary artery disease. The patient was referred for surgical consultation.  Patient is married and lives in [place] with his wife. They have 2 adult children. 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 He has remained fairly active  physically until recently. Over the last several months he has developed worsening exertional shortness of breath and fatigue. He now gets short of breath and tired with moderate level activity. He has not had resting shortness of breath, PND, orthopnea, or lower extremity edema. He has had several dizzy spells without syncope. He reports no other significant physical limitations.  He has not had any further episodes of GI bleeding since 2008.  Past Medical History:  Diagnosis Date  . Anemia 04/03/2017  . Asthma 04/03/2017  . Bicuspid aortic valve 04/06/2016  . Diverticulosis 04/03/2017  . 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 Essential hypertension 04/06/2016  . Hyperlipidemia 04/06/2016  . Mild aortic stenosis 04/06/2016   Overview:  Mild at last FU 2015  . Obstructive sleep apnea 04/03/2017  . Psoriasis 04/03/2017    Past Surgical History:  Procedure Laterality Date  . HERNIA REPAIR    . LITHOTRIPSY    . RIGHT/LEFT HEART CATH AND CORONARY ANGIOGRAPHY N/A 04/24/2017   Procedure: RIGHT/LEFT HEART CATH AND CORONARY ANGIOGRAPHY;  Surgeon: Lyn Records, MD;  Location: [facility];  Service: Cardiovascular;  Laterality: N/A;    Family History  Problem Relation Age of Onset  . Diabetes Father   . Cancer Sister     Social History   Social History  . Marital status: Married    Spouse name: N/A  . Number of children: N/A  . Years of education: N/A   Occupational History  . Not on file.   Social History Main Topics  . Smoking status: Never Smoker  . Smokeless tobacco: Never  Used  . Alcohol use No  . Drug use: No  . Sexual activity: Not on file   Other Topics Concern  . Not on file   Social History Narrative  . No narrative on file    Current Outpatient Prescriptions  Medication Sig Dispense Refill  . aspirin EC 81 MG tablet Take 81 mg by mouth daily.    . cholecalciferol (VITAMIN D) 1000 units tablet Take 2,000 Units by mouth daily.      Marland Kitchen LORazepam (ATIVAN) 1 MG tablet Take 1 tablet (1 mg total) by mouth 2 (two) times daily as needed for anxiety. 30 tablet 0  . losartan (COZAAR) 100 MG tablet Take 1 tablet (100 mg total) by mouth daily. Red=uce to 1/2 for a systolic of < 105 90 tablet 3  . montelukast (SINGULAIR) 10 MG tablet Take 10 mg by mouth daily as needed (allergies).     . Multiple Vitamin (MULTIVITAMIN WITH MINERALS) TABS tablet Take 1 tablet by mouth daily.    . ustekinumab (STELARA) 90 MG/ML SOSY injection Inject 90 mg into the skin every 3 (three) months.     Marland Kitchen albuterol (PROVENTIL HFA;VENTOLIN HFA) 108 (90 Base) MCG/ACT inhaler Inhale 2 puffs into the lungs every 4 (four) hours as needed for wheezing.     No current facility-administered medications for this visit.     Allergies  Allergen Reactions  . Sulfamethoxazole Rash      Review of Systems:   General:  normal appetite, decreased energy, + slow gradual weight gain, no weight loss, no fever  Cardiac:  + chest pain with exertion, no chest pain at rest, + SOB with exertion, no resting SOB, no PND, no orthopnea, + palpitations, no arrhythmia, no atrial fibrillation, no LE edema, + dizzy spells, no syncope  Respiratory:  + exertional shortness of breath, no home oxygen, no productive cough, no dry cough, no bronchitis, no wheezing, no hemoptysis, no asthma, no pain with inspiration or cough, + sleep apnea, no CPAP at night  GI:   no difficulty swallowing, no reflux, no frequent heartburn, no hiatal hernia, no abdominal pain, no constipation, no diarrhea, no hematochezia, no hematemesis, no melena  GU:   no dysuria,  no frequency, no urinary tract infection, no hematuria, no enlarged prostate, + kidney stones I remote past, no kidney disease  Vascular:  no pain suggestive of claudication, no pain in feet, + leg cramps, no varicose veins, no DVT, no non-healing foot ulcer  Neuro:   no stroke, no TIA's, no seizures, no headaches, no temporary blindness one eye,   no slurred speech, no peripheral neuropathy, no chronic pain, no instability of gait, no memory/cognitive dysfunction  Musculoskeletal: no arthritis, no joint swelling, no myalgias, no difficulty walking, normal mobility   Skin:   no rash, no itching, no skin infections, no pressure sores or ulcerations  Psych:   no anxiety, no depression, no nervousness, no unusual recent stress  Eyes:   no blurry vision, no floaters, no recent vision changes, + wears glasses only for reading  ENT:   no hearing loss, no loose or painful teeth, no dentures, last saw dentist 04/30/2017  Hematologic:  no easy bruising, no abnormal bleeding, no clotting disorder, no frequent epistaxis  Endocrine:  no diabetes, does not check CBG's at home     Physical Exam:   BP (!) 140/98   Pulse (!) 103   Ht  (1.854 m)   Wt 262 lb (118.8 kg)   SpO2  98%   BMI 34.57 kg/m   General:  Moderately obese, o/w well-appearing  HEENT:  Unremarkable   Neck:   no JVD, no bruits, no adenopathy   Chest:   clear to auscultation, symmetrical breath sounds, no wheezes, no rhonchi   CV:   RRR, grade III/VI harsh systolic murmur   Abdomen:  soft, non-tender, no masses   Extremities:  warm, well-perfused, pulses palpable, no LE edema  Rectal/GU  Deferred  Neuro:   Grossly non-focal and symmetrical throughout  Skin:   Clean and dry, no rashes, no breakdown   Diagnostic Tests:  TRANSTHORACIC ECHOCARDIOGRAM  Images from transthoracic echocardiogram performed 04/17/2017 are reviewed. Complete official report from this echocardiogram is not currently available.  The patient has normal left ventricular systolic function with at least mild left ventricular hypertrophy. The patient's aortic valve appears functionally bicuspid with fusion of the left and right leaflets (Sievers type I).  There is severe calcification and restricted leaflet mobility with severe aortic stenosis. There is trivial aortic insufficiency. Peak velocity across the  aortic valve ranging between 3.8 and 4.2 m/s corresponding to mean transvalvular gradients ranging between 34 and 42 mmHg respectively. Aortic valve area was calculated between 0.66 and 0.81 cm. DVI was not reported. No other significant abnormalities were noted although the aortic annulus was somewhat dilated and reported 3.9 cm in diameter the left ventricular outflow tract was measured 2.0 cm.   RIGHT/LEFT HEART CATH AND CORONARY ANGIOGRAPHY  Conclusion    Moderately severe to severe aortic stenosis, peak to peak gradient 58 mmHg, mean gradient 43 mmHg. Heavily calcified aortic valve with decreased leaflet motion.  Right dominant coronary anatomy. 30-40% proximal RCA eccentric narrowing.  Normal left main .  Normal circumflex.  Normal ramus intermedius.  Normal right heart pressures.   RECOMMENDATIONS:   Consider referral to heart valve clinic for consideration of SAVR vs TAVR  Indications   Severe aortic stenosis [I35.0 (ICD-10-CM)]  Bicuspid aortic valve [Q23.1 (ICD-10-CM)]  Procedural Details/Technique   Technical Details The right radial area was sterilely prepped and draped. Intravenous sedation with Versed and fentanyl was administered. 1% Xylocaine was infiltrated to achieve local analgesia. Using real-time vascular ultrasound, a double wall stick with an angiocath was utilized to obtain intra-arterial access. The modified Seldinger technique was used to place a 90F " Slender" sheath in the right radial artery. Weight based heparin was administered. Coronary angiography was done using 5 F catheters. Right coronary angiography was performed with a JR4. Left ventricular hemodymic recordings and angiography was done using the JR 4 catheter and hand injection. The severely stenosed aortic valve was crossed using a 0.035 straight wire within the Judkins right. Left coronary angiography was performed with a JL 3.5 cm.  Right heart catheterization was performed by exchanging a  previously placed antecubital IV angio-cath for a 5 French Slender sheath. 1% Xylocaine was used to locally nesthetize the area around the IV site. The IV catheter was wired using an .018 guidewire. The modified Seldinger technique was used to place the 5 [place] sheath. Double glove technique was used to enhance sterility. After sheath insertion, right heart cath was performed using a 5 French balloon tipped catheter and fluoroscopic guidance. Pressures were recorded in each chamber and in the pulmonary capillary wedge position.. The main pulmonary artery O2 saturation was sampled.   Hemostasis was achieved using a pneumatic band.  During this procedure the patient is administered a total of Versed 3 mg and Fentanyl 100 mg to achieve  and maintain moderate conscious sedation. The patient's heart rate, blood pressure, and oxygen saturation are monitored continuously during the procedure. The period of conscious sedation is 56 minutes, of which I was present face-to-face 100% of this time.   Estimated blood loss <50 mL.  During this procedure the patient was administered the following to achieve and maintain moderate conscious sedation: Versed 3 mg, Fentanyl 100 mcg, while the patient's heart rate, blood pressure, and oxygen saturation were continuously monitored. The period of conscious sedation was 56 minutes, of which I was present face-to-face 100% of this time.    Coronary Findings   Dominance: Right  Right Coronary Artery  Prox RCA lesion, 40% stenosed. The lesion is eccentric.  Right Heart   Right Heart Pressures Hemodynamic findings consistent with pulmonary hypertension. LV EDP is normal.    Wall Motion   Resting               Left Heart   Left Ventricle The left ventricular size is normal. The left ventricular systolic function is normal. LV end diastolic pressure is normal. The left ventricular ejection fraction is 55-65% by visual estimate. No regional wall motion abnormalities.     Coronary Diagrams   Diagnostic Diagram       Implants     No implant documentation for this case.  MERGE Images   Show images for CARDIAC CATHETERIZATION   Link to Procedure Log   Procedure Log    Hemo Data    Most Recent Value  Fick Cardiac Output 6.79 L/min  Fick Cardiac Output Index 2.81 (L/min)/BSA  Aortic Mean Gradient 42.7 mmHg  Aortic Peak Gradient 58 mmHg  Aortic Valve Area 1.17  Aortic Value Area Index 0.48 cm2/BSA  RA A Wave 7 mmHg  RA V Wave 3 mmHg  RA Mean 4 mmHg  RV Systolic Pressure 28 mmHg  RV Diastolic Pressure 3 mmHg  RV EDP 6 mmHg  PA Systolic Pressure 30 mmHg  PA Diastolic Pressure 10 mmHg  PA Mean 15 mmHg  PW A Wave 12 mmHg  PW V Wave 10 mmHg  PW Mean 8 mmHg  AO Systolic Pressure 102 mmHg  AO Diastolic Pressure 74 mmHg  AO Mean 89 mmHg  LV Systolic Pressure 156 mmHg  LV Diastolic Pressure 2 mmHg  LV EDP 5 mmHg  Arterial Occlusion Pressure Extended Systolic Pressure 104 mmHg  Arterial Occlusion Pressure Extended Diastolic Pressure 79 mmHg  Arterial Occlusion Pressure Extended Mean Pressure 91 mmHg  Left Ventricular Apex Extended Systolic Pressure 162 mmHg  Left Ventricular Apex Extended Diastolic Pressure 4 mmHg  Left Ventricular Apex Extended EDP Pressure 11 mmHg  QP/QS 1  TPVR Index 5.34 HRUI  TSVR Index 31.7 HRUI  PVR SVR Ratio 0.08  TPVR/TSVR Ratio 0.17       Impression:  Patient has a bicuspid aortic valve with stage D severe symptomatic aortic stenosis.  He presents with progressive symptoms of exertional shortness of breath, chest tightness, and decreased energy consistent with chronic diastolic congestive heart failure, New York Heart Association functional class IIB.  He has also had some dizzy spells without syncope. I have personally reviewed the patient's most recent transthoracic echocardiogram and diagnostic cardiac catheterization. The patient has what appears to be a Sievers type I bicuspid aortic valve with severe  thickening, calcification, and restricted leaflet mobility causing severe aortic stenosis.  The aortic annulus and aortic root appears somewhat dilated, and previous chest CT scan performed more than one year ago revealed moderate  aneurysmal enlargement of the proximal ascending thoracic aorta with maximum transverse diameter 4.3 cm. Diagnostic cardiac catheterization confirmed the presence of severe aortic stenosis with peak to peak and mean transvalvular gradients measured 58 and 43 mmHg, respectively. The patient had mild nonobstructive coronary artery disease. I agree the patient needs aortic valve replacement. He may be a reasonably good candidate for minimally invasive approach for surgery, although he may need concomitant resection and grafting of the proximal ascending thoracic aorta and/or aortic root replacement.   Plan:  The patient and his wife were counseled at length regarding treatment alternatives for management of severe aortic stenosis including continued medical therapy versus proceeding with aortic valve replacement in the near future.  The natural history of aortic stenosis was reviewed, as was long term prognosis with medical therapy alone.  Surgical options were discussed at length including conventional surgical aortic valve replacement through either a full median sternotomy or using minimally invasive techniques.  Other alternatives including rapid-deployment bioprosthetic tissue valve replacement, transcatheter aortic valve replacement, patch enlargement of the aortic root, stentless porcine aortic root replacement, valve repair, the Ross autograft procedure, and homograft aortic root replacement were discussed.  Discussion was held comparing the relative risks of mechanical valve replacement with need for lifelong anticoagulation versus use of a bioprosthetic tissue valve and the associated potential for late structural valve deterioration and failure.  The need for CT angiography to  more accurately evaluate the size of the aortic root and ascending thoracic aorta was discussed.  We will obtain cardiac gated CT angiogram of the heart to precisely evaluate the size and anatomical characteristics of the patient's aortic valve, aortic root, and proximal ascending thoracic aorta.  We will also get CTA c/a/p at the same time to evaluate the feasibility of peripheral cannulation for surgery.  The patient has previously scheduled an appointment with Dr. Silvestre Mesi at [facility] for a second opinion.  He will contact our office once this is been done and he is ready to make final plans for surgery. All of his questions have been addressed.   I spent in excess of 90 minutes during the conduct of this office consultation and >50% of this time involved direct face-to-face encounter with the patient for counseling and/or coordination of their care.   Salvatore Decent. Cornelius Moras, MD 06/02/2017 12:50 PM

## 2017-06-02 NOTE — Patient Instructions (Signed)
Continue all previous medications without any changes at this time  

## 2017-06-03 ENCOUNTER — Other Ambulatory Visit: Payer: Self-pay | Admitting: *Deleted

## 2017-06-04 ENCOUNTER — Other Ambulatory Visit: Payer: Self-pay | Admitting: *Deleted

## 2017-06-04 DIAGNOSIS — I350 Nonrheumatic aortic (valve) stenosis: Secondary | ICD-10-CM

## 2017-06-05 DIAGNOSIS — Q230 Congenital stenosis of aortic valve: Secondary | ICD-10-CM | POA: Diagnosis not present

## 2017-06-05 DIAGNOSIS — I712 Thoracic aortic aneurysm, without rupture: Secondary | ICD-10-CM | POA: Diagnosis not present

## 2017-06-05 DIAGNOSIS — Q231 Congenital insufficiency of aortic valve: Secondary | ICD-10-CM | POA: Diagnosis not present

## 2017-06-11 ENCOUNTER — Ambulatory Visit (HOSPITAL_COMMUNITY): Payer: BC Managed Care – PPO

## 2017-06-11 ENCOUNTER — Ambulatory Visit (HOSPITAL_COMMUNITY)
Admission: RE | Admit: 2017-06-11 | Discharge: 2017-06-11 | Disposition: A | Discharge status: Home / Self Care | Payer: BC Managed Care – PPO | Source: Ambulatory Visit | Attending: Thoracic Surgery (Cardiothoracic Vascular Surgery) | Admitting: Thoracic Surgery (Cardiothoracic Vascular Surgery)

## 2017-06-11 DIAGNOSIS — N200 Calculus of kidney: Secondary | ICD-10-CM | POA: Diagnosis not present

## 2017-06-11 DIAGNOSIS — I700 Atherosclerosis of aorta: Secondary | ICD-10-CM | POA: Diagnosis not present

## 2017-06-11 DIAGNOSIS — Q231 Congenital insufficiency of aortic valve: Secondary | ICD-10-CM | POA: Diagnosis not present

## 2017-06-11 DIAGNOSIS — I77810 Thoracic aortic ectasia: Secondary | ICD-10-CM | POA: Diagnosis not present

## 2017-06-11 DIAGNOSIS — Z955 Presence of coronary angioplasty implant and graft: Secondary | ICD-10-CM | POA: Diagnosis not present

## 2017-06-11 DIAGNOSIS — I350 Nonrheumatic aortic (valve) stenosis: Secondary | ICD-10-CM

## 2017-06-11 MED ORDER — IOPAMIDOL (ISOVUE-370) INJECTION 76%
INTRAVENOUS | Status: AC
  Administered 2017-06-11: 100 mL
  Filled 2017-06-11: qty 100

## 2017-06-11 MED ORDER — METOPROLOL TARTRATE 5 MG/5ML IV SOLN
5.0000 mg | INTRAVENOUS | Status: DC | PRN
  Administered 2017-06-11 (×2): 5 mg via INTRAVENOUS
  Filled 2017-06-11 (×2): qty 5

## 2017-06-11 MED ORDER — METOPROLOL TARTRATE 5 MG/5ML IV SOLN
INTRAVENOUS | Status: AC
  Filled 2017-06-11: qty 10

## 2017-06-18 DIAGNOSIS — I5032 Chronic diastolic (congestive) heart failure: Secondary | ICD-10-CM

## 2017-06-18 HISTORY — DX: Chronic diastolic (congestive) heart failure: I50.32

## 2017-06-22 DIAGNOSIS — I5032 Chronic diastolic (congestive) heart failure: Secondary | ICD-10-CM | POA: Diagnosis not present

## 2017-06-22 DIAGNOSIS — Q231 Congenital insufficiency of aortic valve: Secondary | ICD-10-CM | POA: Diagnosis not present

## 2017-06-22 DIAGNOSIS — G8918 Other acute postprocedural pain: Secondary | ICD-10-CM | POA: Diagnosis not present

## 2017-06-22 DIAGNOSIS — I959 Hypotension, unspecified: Secondary | ICD-10-CM | POA: Diagnosis not present

## 2017-06-22 DIAGNOSIS — Z452 Encounter for adjustment and management of vascular access device: Secondary | ICD-10-CM | POA: Diagnosis not present

## 2017-06-22 DIAGNOSIS — J810 Acute pulmonary edema: Secondary | ICD-10-CM | POA: Diagnosis not present

## 2017-06-22 DIAGNOSIS — R918 Other nonspecific abnormal finding of lung field: Secondary | ICD-10-CM | POA: Diagnosis not present

## 2017-06-22 DIAGNOSIS — I350 Nonrheumatic aortic (valve) stenosis: Secondary | ICD-10-CM | POA: Diagnosis not present

## 2017-06-22 DIAGNOSIS — I440 Atrioventricular block, first degree: Secondary | ICD-10-CM | POA: Diagnosis not present

## 2017-06-22 DIAGNOSIS — L409 Psoriasis, unspecified: Secondary | ICD-10-CM | POA: Diagnosis not present

## 2017-06-22 DIAGNOSIS — J45909 Unspecified asthma, uncomplicated: Secondary | ICD-10-CM | POA: Diagnosis not present

## 2017-06-22 DIAGNOSIS — J9811 Atelectasis: Secondary | ICD-10-CM | POA: Diagnosis not present

## 2017-06-22 DIAGNOSIS — Z952 Presence of prosthetic heart valve: Secondary | ICD-10-CM | POA: Diagnosis not present

## 2017-06-22 DIAGNOSIS — I517 Cardiomegaly: Secondary | ICD-10-CM | POA: Diagnosis not present

## 2017-06-22 DIAGNOSIS — I110 Hypertensive heart disease with heart failure: Secondary | ICD-10-CM | POA: Diagnosis not present

## 2017-06-22 DIAGNOSIS — E1151 Type 2 diabetes mellitus with diabetic peripheral angiopathy without gangrene: Secondary | ICD-10-CM | POA: Diagnosis not present

## 2017-06-22 DIAGNOSIS — Z7982 Long term (current) use of aspirin: Secondary | ICD-10-CM | POA: Diagnosis not present

## 2017-06-22 DIAGNOSIS — Z6836 Body mass index (BMI) 36.0-36.9, adult: Secondary | ICD-10-CM | POA: Diagnosis not present

## 2017-06-22 DIAGNOSIS — D62 Acute posthemorrhagic anemia: Secondary | ICD-10-CM | POA: Diagnosis not present

## 2017-06-22 DIAGNOSIS — I712 Thoracic aortic aneurysm, without rupture: Secondary | ICD-10-CM | POA: Diagnosis not present

## 2017-06-22 DIAGNOSIS — G4733 Obstructive sleep apnea (adult) (pediatric): Secondary | ICD-10-CM | POA: Diagnosis not present

## 2017-06-22 DIAGNOSIS — I700 Atherosclerosis of aorta: Secondary | ICD-10-CM | POA: Diagnosis not present

## 2017-06-22 DIAGNOSIS — E669 Obesity, unspecified: Secondary | ICD-10-CM | POA: Diagnosis not present

## 2017-06-22 DIAGNOSIS — Q230 Congenital stenosis of aortic valve: Secondary | ICD-10-CM | POA: Diagnosis not present

## 2017-06-22 DIAGNOSIS — Z4682 Encounter for fitting and adjustment of non-vascular catheter: Secondary | ICD-10-CM | POA: Diagnosis not present

## 2017-06-22 DIAGNOSIS — J90 Pleural effusion, not elsewhere classified: Secondary | ICD-10-CM | POA: Diagnosis not present

## 2017-06-23 DIAGNOSIS — Q231 Congenital insufficiency of aortic valve: Secondary | ICD-10-CM | POA: Diagnosis not present

## 2017-06-23 DIAGNOSIS — I700 Atherosclerosis of aorta: Secondary | ICD-10-CM | POA: Diagnosis not present

## 2017-06-23 DIAGNOSIS — I5032 Chronic diastolic (congestive) heart failure: Secondary | ICD-10-CM | POA: Diagnosis not present

## 2017-06-23 DIAGNOSIS — I712 Thoracic aortic aneurysm, without rupture: Secondary | ICD-10-CM | POA: Diagnosis not present

## 2017-06-23 DIAGNOSIS — I110 Hypertensive heart disease with heart failure: Secondary | ICD-10-CM | POA: Diagnosis not present

## 2017-06-23 DIAGNOSIS — I350 Nonrheumatic aortic (valve) stenosis: Secondary | ICD-10-CM | POA: Diagnosis not present

## 2017-06-23 DIAGNOSIS — Z952 Presence of prosthetic heart valve: Secondary | ICD-10-CM | POA: Diagnosis not present

## 2017-06-23 DIAGNOSIS — J9811 Atelectasis: Secondary | ICD-10-CM | POA: Diagnosis not present

## 2017-06-24 DIAGNOSIS — Z952 Presence of prosthetic heart valve: Secondary | ICD-10-CM | POA: Diagnosis not present

## 2017-06-24 DIAGNOSIS — D72829 Elevated white blood cell count, unspecified: Secondary | ICD-10-CM

## 2017-06-24 DIAGNOSIS — Z95828 Presence of other vascular implants and grafts: Secondary | ICD-10-CM

## 2017-06-24 DIAGNOSIS — Q230 Congenital stenosis of aortic valve: Secondary | ICD-10-CM | POA: Diagnosis not present

## 2017-06-24 DIAGNOSIS — J810 Acute pulmonary edema: Secondary | ICD-10-CM | POA: Diagnosis not present

## 2017-06-24 DIAGNOSIS — Q231 Congenital insufficiency of aortic valve: Secondary | ICD-10-CM | POA: Diagnosis not present

## 2017-06-24 DIAGNOSIS — G8918 Other acute postprocedural pain: Secondary | ICD-10-CM | POA: Diagnosis not present

## 2017-06-24 DIAGNOSIS — R918 Other nonspecific abnormal finding of lung field: Secondary | ICD-10-CM | POA: Diagnosis not present

## 2017-06-24 DIAGNOSIS — G4733 Obstructive sleep apnea (adult) (pediatric): Secondary | ICD-10-CM | POA: Diagnosis not present

## 2017-06-24 HISTORY — DX: Presence of other vascular implants and grafts: Z95.828

## 2017-06-24 HISTORY — DX: Elevated white blood cell count, unspecified: D72.829

## 2017-06-25 DIAGNOSIS — Z4682 Encounter for fitting and adjustment of non-vascular catheter: Secondary | ICD-10-CM | POA: Diagnosis not present

## 2017-06-25 DIAGNOSIS — G8918 Other acute postprocedural pain: Secondary | ICD-10-CM | POA: Diagnosis not present

## 2017-06-25 DIAGNOSIS — J9811 Atelectasis: Secondary | ICD-10-CM | POA: Diagnosis not present

## 2017-06-25 DIAGNOSIS — J90 Pleural effusion, not elsewhere classified: Secondary | ICD-10-CM | POA: Diagnosis not present

## 2017-06-25 DIAGNOSIS — Z452 Encounter for adjustment and management of vascular access device: Secondary | ICD-10-CM | POA: Diagnosis not present

## 2017-06-25 DIAGNOSIS — Q231 Congenital insufficiency of aortic valve: Secondary | ICD-10-CM | POA: Diagnosis not present

## 2017-06-26 DIAGNOSIS — I440 Atrioventricular block, first degree: Secondary | ICD-10-CM | POA: Insufficient documentation

## 2017-06-26 DIAGNOSIS — R918 Other nonspecific abnormal finding of lung field: Secondary | ICD-10-CM | POA: Diagnosis not present

## 2017-06-26 DIAGNOSIS — Q231 Congenital insufficiency of aortic valve: Secondary | ICD-10-CM | POA: Diagnosis not present

## 2017-06-26 DIAGNOSIS — G8918 Other acute postprocedural pain: Secondary | ICD-10-CM | POA: Diagnosis not present

## 2017-06-26 HISTORY — DX: Atrioventricular block, first degree: I44.0

## 2017-06-27 DIAGNOSIS — Q231 Congenital insufficiency of aortic valve: Secondary | ICD-10-CM | POA: Diagnosis not present

## 2017-06-27 DIAGNOSIS — I517 Cardiomegaly: Secondary | ICD-10-CM | POA: Diagnosis not present

## 2017-06-27 DIAGNOSIS — J9811 Atelectasis: Secondary | ICD-10-CM | POA: Diagnosis not present

## 2017-06-28 DIAGNOSIS — Q231 Congenital insufficiency of aortic valve: Secondary | ICD-10-CM | POA: Diagnosis not present

## 2017-07-04 ENCOUNTER — Telehealth: Payer: Self-pay | Admitting: Cardiology

## 2017-07-04 DIAGNOSIS — R0600 Dyspnea, unspecified: Secondary | ICD-10-CM | POA: Diagnosis not present

## 2017-07-04 DIAGNOSIS — J9811 Atelectasis: Secondary | ICD-10-CM | POA: Diagnosis not present

## 2017-07-04 DIAGNOSIS — G4701 Insomnia due to medical condition: Secondary | ICD-10-CM | POA: Diagnosis not present

## 2017-07-04 DIAGNOSIS — R002 Palpitations: Secondary | ICD-10-CM | POA: Diagnosis not present

## 2017-07-04 DIAGNOSIS — D72829 Elevated white blood cell count, unspecified: Secondary | ICD-10-CM | POA: Diagnosis not present

## 2017-07-04 NOTE — Telephone Encounter (Signed)
Wife, Misty StanleyLisa, called and advised they were able to get in with there PCP and they are doing EKG and blood work.

## 2017-07-04 NOTE — Telephone Encounter (Signed)
Please advise which lab work.

## 2017-07-04 NOTE — Telephone Encounter (Signed)
[facility] having irregular heartrate [facility] and they called the surgeon and he wants an EKG and labwork, can we do this??

## 2017-07-07 DIAGNOSIS — R748 Abnormal levels of other serum enzymes: Secondary | ICD-10-CM | POA: Insufficient documentation

## 2017-07-07 DIAGNOSIS — Z125 Encounter for screening for malignant neoplasm of prostate: Secondary | ICD-10-CM | POA: Insufficient documentation

## 2017-07-07 DIAGNOSIS — R05 Cough: Secondary | ICD-10-CM | POA: Diagnosis not present

## 2017-07-07 DIAGNOSIS — G4700 Insomnia, unspecified: Secondary | ICD-10-CM

## 2017-07-07 DIAGNOSIS — I712 Thoracic aortic aneurysm, without rupture, unspecified: Secondary | ICD-10-CM | POA: Insufficient documentation

## 2017-07-07 DIAGNOSIS — I10 Essential (primary) hypertension: Secondary | ICD-10-CM | POA: Diagnosis not present

## 2017-07-07 DIAGNOSIS — D72829 Elevated white blood cell count, unspecified: Secondary | ICD-10-CM | POA: Diagnosis not present

## 2017-07-07 HISTORY — DX: Thoracic aortic aneurysm, without rupture, unspecified: I71.20

## 2017-07-07 HISTORY — DX: Encounter for screening for malignant neoplasm of prostate: Z12.5

## 2017-07-07 HISTORY — DX: Insomnia, unspecified: G47.00

## 2017-07-07 HISTORY — DX: Abnormal levels of other serum enzymes: R74.8

## 2017-07-08 ENCOUNTER — Telehealth: Payer: Self-pay | Admitting: Cardiology

## 2017-07-08 NOTE — Telephone Encounter (Signed)
Patient's wife, Misty StanleyLisa, states that their pharmacist states that they do not keep up with which LOT numbers go to which patients and hung up the phone on her. Misty StanleyLisa states she did research and found that it is just losartan potassium hydrochlorothiazide at this time. Our office research revealed this as well. Reviewed with Misty StanleyLisa and Dr. Dulce SellarMunley, patient will continue with prescription of losartan 100 mg at this time and will follow up if any further recalls come out regarding losartan. No further questions.

## 2017-07-08 NOTE — Telephone Encounter (Signed)
Have questions about the recall on Losartin and what does he need to do now

## 2017-07-24 DIAGNOSIS — Z9889 Other specified postprocedural states: Secondary | ICD-10-CM | POA: Diagnosis not present

## 2017-07-24 DIAGNOSIS — J9811 Atelectasis: Secondary | ICD-10-CM | POA: Diagnosis not present

## 2017-07-24 DIAGNOSIS — Z95828 Presence of other vascular implants and grafts: Secondary | ICD-10-CM | POA: Diagnosis not present

## 2017-07-24 DIAGNOSIS — Z952 Presence of prosthetic heart valve: Secondary | ICD-10-CM | POA: Diagnosis not present

## 2017-07-24 DIAGNOSIS — Q230 Congenital stenosis of aortic valve: Secondary | ICD-10-CM | POA: Diagnosis not present

## 2017-07-24 DIAGNOSIS — Q231 Congenital insufficiency of aortic valve: Secondary | ICD-10-CM | POA: Diagnosis not present

## 2017-07-28 ENCOUNTER — Telehealth: Payer: Self-pay | Admitting: Cardiology

## 2017-07-28 NOTE — Telephone Encounter (Signed)
Dr. Hulen ShoutsMunley's notes sent. Advised [facility]'s records would not be sent.

## 2017-07-28 NOTE — Telephone Encounter (Signed)
States pt was referred to cardiac rehab but they have not received records/[phone]

## 2017-07-29 DIAGNOSIS — Z79899 Other long term (current) drug therapy: Secondary | ICD-10-CM | POA: Diagnosis not present

## 2017-07-29 DIAGNOSIS — Z952 Presence of prosthetic heart valve: Secondary | ICD-10-CM | POA: Diagnosis not present

## 2017-07-29 DIAGNOSIS — Z954 Presence of other heart-valve replacement: Secondary | ICD-10-CM | POA: Diagnosis not present

## 2017-07-29 DIAGNOSIS — Z7982 Long term (current) use of aspirin: Secondary | ICD-10-CM | POA: Diagnosis not present

## 2017-07-29 DIAGNOSIS — I10 Essential (primary) hypertension: Secondary | ICD-10-CM | POA: Diagnosis not present

## 2017-07-30 DIAGNOSIS — I10 Essential (primary) hypertension: Secondary | ICD-10-CM | POA: Diagnosis not present

## 2017-07-30 DIAGNOSIS — Z79899 Other long term (current) drug therapy: Secondary | ICD-10-CM | POA: Diagnosis not present

## 2017-07-30 DIAGNOSIS — Z954 Presence of other heart-valve replacement: Secondary | ICD-10-CM | POA: Diagnosis not present

## 2017-07-30 DIAGNOSIS — Z952 Presence of prosthetic heart valve: Secondary | ICD-10-CM | POA: Diagnosis not present

## 2017-07-30 DIAGNOSIS — Z7982 Long term (current) use of aspirin: Secondary | ICD-10-CM | POA: Diagnosis not present

## 2017-08-01 DIAGNOSIS — Z79899 Other long term (current) drug therapy: Secondary | ICD-10-CM | POA: Diagnosis not present

## 2017-08-01 DIAGNOSIS — I10 Essential (primary) hypertension: Secondary | ICD-10-CM | POA: Diagnosis not present

## 2017-08-01 DIAGNOSIS — Z954 Presence of other heart-valve replacement: Secondary | ICD-10-CM | POA: Diagnosis not present

## 2017-08-01 DIAGNOSIS — Z952 Presence of prosthetic heart valve: Secondary | ICD-10-CM | POA: Diagnosis not present

## 2017-08-01 DIAGNOSIS — Z7982 Long term (current) use of aspirin: Secondary | ICD-10-CM | POA: Diagnosis not present

## 2017-08-06 DIAGNOSIS — Z79899 Other long term (current) drug therapy: Secondary | ICD-10-CM | POA: Diagnosis not present

## 2017-08-06 DIAGNOSIS — Z952 Presence of prosthetic heart valve: Secondary | ICD-10-CM | POA: Diagnosis not present

## 2017-08-06 DIAGNOSIS — Z954 Presence of other heart-valve replacement: Secondary | ICD-10-CM | POA: Diagnosis not present

## 2017-08-06 DIAGNOSIS — Z7982 Long term (current) use of aspirin: Secondary | ICD-10-CM | POA: Diagnosis not present

## 2017-08-06 DIAGNOSIS — I10 Essential (primary) hypertension: Secondary | ICD-10-CM | POA: Diagnosis not present

## 2017-08-08 DIAGNOSIS — I10 Essential (primary) hypertension: Secondary | ICD-10-CM | POA: Diagnosis not present

## 2017-08-08 DIAGNOSIS — Z79899 Other long term (current) drug therapy: Secondary | ICD-10-CM | POA: Diagnosis not present

## 2017-08-08 DIAGNOSIS — Z954 Presence of other heart-valve replacement: Secondary | ICD-10-CM | POA: Diagnosis not present

## 2017-08-08 DIAGNOSIS — Z7982 Long term (current) use of aspirin: Secondary | ICD-10-CM | POA: Diagnosis not present

## 2017-08-08 DIAGNOSIS — Z952 Presence of prosthetic heart valve: Secondary | ICD-10-CM | POA: Diagnosis not present

## 2017-08-11 DIAGNOSIS — I10 Essential (primary) hypertension: Secondary | ICD-10-CM | POA: Diagnosis not present

## 2017-08-11 DIAGNOSIS — Z79899 Other long term (current) drug therapy: Secondary | ICD-10-CM | POA: Diagnosis not present

## 2017-08-11 DIAGNOSIS — Z952 Presence of prosthetic heart valve: Secondary | ICD-10-CM | POA: Diagnosis not present

## 2017-08-11 DIAGNOSIS — Z7982 Long term (current) use of aspirin: Secondary | ICD-10-CM | POA: Diagnosis not present

## 2017-08-11 DIAGNOSIS — Z954 Presence of other heart-valve replacement: Secondary | ICD-10-CM | POA: Diagnosis not present

## 2017-08-15 DIAGNOSIS — Z954 Presence of other heart-valve replacement: Secondary | ICD-10-CM | POA: Diagnosis not present

## 2017-08-15 DIAGNOSIS — Z952 Presence of prosthetic heart valve: Secondary | ICD-10-CM | POA: Diagnosis not present

## 2017-08-15 DIAGNOSIS — I10 Essential (primary) hypertension: Secondary | ICD-10-CM | POA: Diagnosis not present

## 2017-08-15 DIAGNOSIS — Z7982 Long term (current) use of aspirin: Secondary | ICD-10-CM | POA: Diagnosis not present

## 2017-08-15 DIAGNOSIS — Z79899 Other long term (current) drug therapy: Secondary | ICD-10-CM | POA: Diagnosis not present

## 2017-08-20 DIAGNOSIS — Z954 Presence of other heart-valve replacement: Secondary | ICD-10-CM | POA: Diagnosis not present

## 2017-08-20 DIAGNOSIS — Z79899 Other long term (current) drug therapy: Secondary | ICD-10-CM | POA: Diagnosis not present

## 2017-08-20 DIAGNOSIS — I10 Essential (primary) hypertension: Secondary | ICD-10-CM | POA: Diagnosis not present

## 2017-08-20 DIAGNOSIS — Z7982 Long term (current) use of aspirin: Secondary | ICD-10-CM | POA: Diagnosis not present

## 2017-08-20 DIAGNOSIS — Z952 Presence of prosthetic heart valve: Secondary | ICD-10-CM | POA: Diagnosis not present

## 2017-08-22 ENCOUNTER — Other Ambulatory Visit: Payer: Self-pay

## 2017-08-22 ENCOUNTER — Telehealth: Payer: Self-pay | Admitting: Cardiology

## 2017-08-22 DIAGNOSIS — I10 Essential (primary) hypertension: Secondary | ICD-10-CM | POA: Diagnosis not present

## 2017-08-22 DIAGNOSIS — Z952 Presence of prosthetic heart valve: Secondary | ICD-10-CM | POA: Diagnosis not present

## 2017-08-22 DIAGNOSIS — Z79899 Other long term (current) drug therapy: Secondary | ICD-10-CM | POA: Diagnosis not present

## 2017-08-22 DIAGNOSIS — Z954 Presence of other heart-valve replacement: Secondary | ICD-10-CM | POA: Diagnosis not present

## 2017-08-22 DIAGNOSIS — Z7982 Long term (current) use of aspirin: Secondary | ICD-10-CM | POA: Diagnosis not present

## 2017-08-22 MED ORDER — METOPROLOL SUCCINATE 25 MG PO CS24
25.0000 mg | EXTENDED_RELEASE_CAPSULE | Freq: Every day | ORAL | 0 refills | Status: DC
Start: 2017-08-22 — End: 2017-09-04

## 2017-08-22 NOTE — Telephone Encounter (Signed)
30 day supply of metoprolol was sent to urgent care pharmacy in [place].

## 2017-08-22 NOTE — Telephone Encounter (Signed)
Patient's spouse called to ask for refill of metoprolol 25mg  sent to [facility] [place].

## 2017-08-22 NOTE — Telephone Encounter (Signed)
Left message for the patient to call the office as metoprolol is not on the med list.

## 2017-08-25 DIAGNOSIS — Z954 Presence of other heart-valve replacement: Secondary | ICD-10-CM | POA: Diagnosis not present

## 2017-08-25 DIAGNOSIS — I10 Essential (primary) hypertension: Secondary | ICD-10-CM | POA: Diagnosis not present

## 2017-08-25 DIAGNOSIS — Z952 Presence of prosthetic heart valve: Secondary | ICD-10-CM | POA: Diagnosis not present

## 2017-08-25 DIAGNOSIS — Z7982 Long term (current) use of aspirin: Secondary | ICD-10-CM | POA: Diagnosis not present

## 2017-08-25 DIAGNOSIS — Z79899 Other long term (current) drug therapy: Secondary | ICD-10-CM | POA: Diagnosis not present

## 2017-08-27 ENCOUNTER — Other Ambulatory Visit: Payer: Self-pay

## 2017-08-27 DIAGNOSIS — I10 Essential (primary) hypertension: Secondary | ICD-10-CM | POA: Diagnosis not present

## 2017-08-27 DIAGNOSIS — Z7982 Long term (current) use of aspirin: Secondary | ICD-10-CM | POA: Diagnosis not present

## 2017-08-27 DIAGNOSIS — Z954 Presence of other heart-valve replacement: Secondary | ICD-10-CM | POA: Diagnosis not present

## 2017-08-27 DIAGNOSIS — Z79899 Other long term (current) drug therapy: Secondary | ICD-10-CM | POA: Diagnosis not present

## 2017-08-27 MED ORDER — NITROGLYCERIN 0.4 MG SL SUBL: 0.4000 mg | SUBLINGUAL_TABLET | SUBLINGUAL | 3 refills | Status: AC | PRN

## 2017-09-01 DIAGNOSIS — Z79899 Other long term (current) drug therapy: Secondary | ICD-10-CM | POA: Diagnosis not present

## 2017-09-01 DIAGNOSIS — I10 Essential (primary) hypertension: Secondary | ICD-10-CM | POA: Diagnosis not present

## 2017-09-01 DIAGNOSIS — Z7982 Long term (current) use of aspirin: Secondary | ICD-10-CM | POA: Diagnosis not present

## 2017-09-01 DIAGNOSIS — Z954 Presence of other heart-valve replacement: Secondary | ICD-10-CM | POA: Diagnosis not present

## 2017-09-03 DIAGNOSIS — Z79899 Other long term (current) drug therapy: Secondary | ICD-10-CM | POA: Diagnosis not present

## 2017-09-03 DIAGNOSIS — I10 Essential (primary) hypertension: Secondary | ICD-10-CM | POA: Diagnosis not present

## 2017-09-03 DIAGNOSIS — Z954 Presence of other heart-valve replacement: Secondary | ICD-10-CM | POA: Diagnosis not present

## 2017-09-03 DIAGNOSIS — Z7982 Long term (current) use of aspirin: Secondary | ICD-10-CM | POA: Diagnosis not present

## 2017-09-04 ENCOUNTER — Ambulatory Visit (INDEPENDENT_AMBULATORY_CARE_PROVIDER_SITE_OTHER): Payer: BLUE CROSS/BLUE SHIELD | Admitting: Cardiology

## 2017-09-04 ENCOUNTER — Encounter: Payer: Self-pay | Admitting: Cardiology

## 2017-09-04 VITALS — BP 140/98 | HR 90 | Ht 73.0 in | Wt 263.0 lb

## 2017-09-04 DIAGNOSIS — Z953 Presence of xenogenic heart valve: Secondary | ICD-10-CM

## 2017-09-04 DIAGNOSIS — I10 Essential (primary) hypertension: Secondary | ICD-10-CM | POA: Diagnosis not present

## 2017-09-04 HISTORY — DX: Presence of xenogenic heart valve: Z95.3

## 2017-09-04 MED ORDER — METOPROLOL SUCCINATE 25 MG PO CS24: 25.0000 mg | EXTENDED_RELEASE_CAPSULE | Freq: Every day | ORAL | 3 refills | Status: DC

## 2017-09-04 NOTE — Progress Notes (Signed)
Cardiology Office Note:    Date:  09/04/2017   ID:  [name] J Yeh, DOB 1961-07-10, MRN [id number]  PCP:  Hadley Penobbins, Robert A, MD  Cardiologist:  Norman HerrlichBrian Munley, MD    Referring MD: Hadley Penobbins, Robert A, MD    ASSESSMENT:    1. Essential hypertension   2. S/P aortic valve replacement with bioprosthetic valve    PLAN:    In order of problems listed above:  1. Stable continue current antihypertensives ARB low-dose beta-blocker 2. Stable he is made a good recovery he has no murmur on examination he needs a baseline echocardiogram with a tissue AVR and then plan follow-up echocardiogram in 2 years.  He will continue to follow endocarditis prophylaxis with amoxicillin.  He will complete cardiac rehabilitation.   Next appointment: 6 months   Medication Adjustments/Labs and Tests Ordered: Current medicines are reviewed at length with the patient today.  Concerns regarding medicines are outlined above.  No orders of the defined types were placed in this encounter.  No orders of the defined types were placed in this encounter.   Chief Complaint  Patient presents with  . Routine Post Op    History of Present Illness:    [name] J Rayman is a 57 y.o. male with a hx of symptomatic aortic stenosis with a bioprosthetic (27 mm Pericardial Ewards) AVR and ascending aorta graft on 06/23/17 at [facility] Dr Silvestre MesiGlower. last seen by me  On 09.09.18. Compliance with diet, lifestyle and medications: Yes He is [facility]  Wounds are healed no sternal pain he has minimal brief momentary palpitation no exercise intolerance shortness of breath syncope or TIA.  His hemoglobin normalized after surgery. Past Medical History:  Diagnosis Date  . Anemia 04/03/2017  . Asthma 04/03/2017  . Bicuspid aortic valve 04/06/2016  . Diverticulosis 04/03/2017  . 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 Essential hypertension 04/06/2016  . Hyperlipidemia 04/06/2016   . Mild aortic stenosis 04/06/2016   Overview:  Mild at last FU 2015  . Obstructive sleep apnea 04/03/2017  . Psoriasis 04/03/2017    Past Surgical History:  Procedure Laterality Date  . HERNIA REPAIR    . LITHOTRIPSY    . RIGHT/LEFT HEART CATH AND CORONARY ANGIOGRAPHY N/A 04/24/2017   Procedure: RIGHT/LEFT HEART CATH AND CORONARY ANGIOGRAPHY;  Surgeon: Lyn RecordsSmith, Henry W, MD;  [facility]: [facility];  Service: Cardiovascular;  Laterality: N/A;    Current Medications: Current Meds  Medication Sig  . albuterol (PROVENTIL HFA;VENTOLIN HFA) 108 (90 Base) MCG/ACT inhaler Inhale 2 puffs into the lungs every 4 (four) hours as needed for wheezing.  Marland Kitchen. aspirin EC 81 MG tablet Take 81 mg by mouth daily.  . cholecalciferol (VITAMIN D) 1000 units tablet Take 2,000 Units by mouth daily.   Marland Kitchen. LORazepam (ATIVAN) 1 MG tablet Take 1 tablet (1 mg total) by mouth 2 (two) times daily as needed for anxiety.  . Metoprolol Succinate 25 MG CS24 Take 25 mg by mouth daily.  . montelukast (SINGULAIR) 10 MG tablet Take 10 mg by mouth daily as needed (allergies).   . Multiple Vitamin (MULTIVITAMIN WITH MINERALS) TABS tablet Take 1 tablet by mouth daily.  . nitroGLYCERIN (NITROSTAT) 0.4 MG SL tablet Place 1 tablet (0.4 mg total) under the tongue every 5 (five) minutes as needed for chest pain.  Marland Kitchen. ustekinumab (STELARA) 90 MG/ML SOSY injection Inject 90 mg into the skin every 3 (three) months.  Allergies:   Sulfamethoxazole   Social History   Socioeconomic History  . Marital status: Married    Spouse name: None  . Number of children: None  . Years of education: None  . Highest education level: None  Social Needs  . Financial resource strain: None  . Food insecurity - worry: None  . Food insecurity - inability: None  . Transportation needs - medical: None  . Transportation needs - non-medical: None  Occupational History  . None  Tobacco Use  . Smoking status: Never Smoker  . Smokeless tobacco: Never  Used  Substance and Sexual Activity  . Alcohol use: No  . Drug use: No  . Sexual activity: None  Other Topics Concern  . None  Social History Narrative  . None     Family History: The patient's family history includes Cancer in his sister; Diabetes in his father. ROS:   Please see the history of present illness.    All other systems reviewed and are negative.  EKGs/Labs/Other Studies Reviewed:    The following studies were reviewed today:  Report from cardiac rehabilitation reviewed at the time of visit exercise intolerance is increased from 3.3-5.9 mets baseline and peak blood pressure and heart rates are within range.  Recent Labs: 07/24/17 CBC is normal 04/22/2017: ALT 33; BUN 19; Creatinine, Ser 1.04; Hemoglobin 17.4; Platelets 214; Potassium 4.3; Sodium 139  Recent Lipid Panel No results found for: CHOL, TRIG, HDL, CHOLHDL, VLDL, LDLCALC, LDLDIRECT  Physical Exam:    VS:  BP (!) 140/98 (BP Location: Left Arm, Patient Position: Sitting, Cuff Size: Large)   Pulse 90   Ht 6\' 1"  (1.854 m)   Wt 263 lb (119.3 kg)   SpO2 99%   BMI 34.70 kg/m     Wt Readings from Last 3 Encounters:  09/04/17 263 lb (119.3 kg)  06/02/17 262 lb (118.8 kg)  05/13/17 264 lb 1.9 oz (119.8 kg)     GEN:  Well nourished, well developed in no acute distress HEENT: Normal NECK: No JVD; No carotid bruits LYMPHATICS: No lymphadenopathy CARDIAC: RRR, no murmurs, rubs, gallops RESPIRATORY:  Clear to auscultation without rales, wheezing or rhonchi  ABDOMEN: Soft, non-tender, non-distended MUSCULOSKELETAL:  No edema; No deformity  SKIN: Warm and dry NEUROLOGIC:  Alert and oriented x 3 PSYCHIATRIC:  Normal affect    Signed, Norman Herrlich, MD  09/04/2017 5:05 PM    [facility]

## 2017-09-04 NOTE — Patient Instructions (Signed)

## 2017-09-05 DIAGNOSIS — Z954 Presence of other heart-valve replacement: Secondary | ICD-10-CM | POA: Diagnosis not present

## 2017-09-05 DIAGNOSIS — Z7982 Long term (current) use of aspirin: Secondary | ICD-10-CM | POA: Diagnosis not present

## 2017-09-05 DIAGNOSIS — Z79899 Other long term (current) drug therapy: Secondary | ICD-10-CM | POA: Diagnosis not present

## 2017-09-05 DIAGNOSIS — I10 Essential (primary) hypertension: Secondary | ICD-10-CM | POA: Diagnosis not present

## 2017-09-08 DIAGNOSIS — Z954 Presence of other heart-valve replacement: Secondary | ICD-10-CM | POA: Diagnosis not present

## 2017-09-08 DIAGNOSIS — Z79899 Other long term (current) drug therapy: Secondary | ICD-10-CM | POA: Diagnosis not present

## 2017-09-08 DIAGNOSIS — Z7982 Long term (current) use of aspirin: Secondary | ICD-10-CM | POA: Diagnosis not present

## 2017-09-08 DIAGNOSIS — I10 Essential (primary) hypertension: Secondary | ICD-10-CM | POA: Diagnosis not present

## 2017-09-10 DIAGNOSIS — Z79899 Other long term (current) drug therapy: Secondary | ICD-10-CM | POA: Diagnosis not present

## 2017-09-10 DIAGNOSIS — Z7982 Long term (current) use of aspirin: Secondary | ICD-10-CM | POA: Diagnosis not present

## 2017-09-10 DIAGNOSIS — Z954 Presence of other heart-valve replacement: Secondary | ICD-10-CM | POA: Diagnosis not present

## 2017-09-10 DIAGNOSIS — I10 Essential (primary) hypertension: Secondary | ICD-10-CM | POA: Diagnosis not present

## 2017-09-12 DIAGNOSIS — Z79899 Other long term (current) drug therapy: Secondary | ICD-10-CM | POA: Diagnosis not present

## 2017-09-12 DIAGNOSIS — Z954 Presence of other heart-valve replacement: Secondary | ICD-10-CM | POA: Diagnosis not present

## 2017-09-12 DIAGNOSIS — I10 Essential (primary) hypertension: Secondary | ICD-10-CM | POA: Diagnosis not present

## 2017-09-12 DIAGNOSIS — Z7982 Long term (current) use of aspirin: Secondary | ICD-10-CM | POA: Diagnosis not present

## 2017-09-15 DIAGNOSIS — Z954 Presence of other heart-valve replacement: Secondary | ICD-10-CM | POA: Diagnosis not present

## 2017-09-15 DIAGNOSIS — Z7982 Long term (current) use of aspirin: Secondary | ICD-10-CM | POA: Diagnosis not present

## 2017-09-15 DIAGNOSIS — Z79899 Other long term (current) drug therapy: Secondary | ICD-10-CM | POA: Diagnosis not present

## 2017-09-15 DIAGNOSIS — I10 Essential (primary) hypertension: Secondary | ICD-10-CM | POA: Diagnosis not present

## 2017-09-17 DIAGNOSIS — Z7982 Long term (current) use of aspirin: Secondary | ICD-10-CM | POA: Diagnosis not present

## 2017-09-17 DIAGNOSIS — I10 Essential (primary) hypertension: Secondary | ICD-10-CM | POA: Diagnosis not present

## 2017-09-17 DIAGNOSIS — Z954 Presence of other heart-valve replacement: Secondary | ICD-10-CM | POA: Diagnosis not present

## 2017-09-17 DIAGNOSIS — Z79899 Other long term (current) drug therapy: Secondary | ICD-10-CM | POA: Diagnosis not present

## 2017-09-19 DIAGNOSIS — I10 Essential (primary) hypertension: Secondary | ICD-10-CM | POA: Diagnosis not present

## 2017-09-19 DIAGNOSIS — Z79899 Other long term (current) drug therapy: Secondary | ICD-10-CM | POA: Diagnosis not present

## 2017-09-19 DIAGNOSIS — Z954 Presence of other heart-valve replacement: Secondary | ICD-10-CM | POA: Diagnosis not present

## 2017-09-19 DIAGNOSIS — Z7982 Long term (current) use of aspirin: Secondary | ICD-10-CM | POA: Diagnosis not present

## 2017-09-22 DIAGNOSIS — I10 Essential (primary) hypertension: Secondary | ICD-10-CM | POA: Diagnosis not present

## 2017-09-22 DIAGNOSIS — Z954 Presence of other heart-valve replacement: Secondary | ICD-10-CM | POA: Diagnosis not present

## 2017-09-22 DIAGNOSIS — Z7982 Long term (current) use of aspirin: Secondary | ICD-10-CM | POA: Diagnosis not present

## 2017-09-22 DIAGNOSIS — Z79899 Other long term (current) drug therapy: Secondary | ICD-10-CM | POA: Diagnosis not present

## 2017-09-24 DIAGNOSIS — Z954 Presence of other heart-valve replacement: Secondary | ICD-10-CM | POA: Diagnosis not present

## 2017-09-24 DIAGNOSIS — Z79899 Other long term (current) drug therapy: Secondary | ICD-10-CM | POA: Diagnosis not present

## 2017-09-24 DIAGNOSIS — I10 Essential (primary) hypertension: Secondary | ICD-10-CM | POA: Diagnosis not present

## 2017-09-24 DIAGNOSIS — Z7982 Long term (current) use of aspirin: Secondary | ICD-10-CM | POA: Diagnosis not present

## 2017-09-25 ENCOUNTER — Ambulatory Visit (HOSPITAL_BASED_OUTPATIENT_CLINIC_OR_DEPARTMENT_OTHER)
Admission: RE | Admit: 2017-09-25 | Discharge: 2017-09-25 | Disposition: A | Discharge status: Home / Self Care | Payer: BLUE CROSS/BLUE SHIELD | Source: Ambulatory Visit | Attending: Cardiology | Admitting: Cardiology

## 2017-09-25 DIAGNOSIS — I060 Rheumatic aortic stenosis: Secondary | ICD-10-CM | POA: Insufficient documentation

## 2017-09-25 DIAGNOSIS — Z953 Presence of xenogenic heart valve: Secondary | ICD-10-CM | POA: Diagnosis not present

## 2017-09-25 DIAGNOSIS — I10 Essential (primary) hypertension: Secondary | ICD-10-CM

## 2017-09-25 NOTE — Progress Notes (Signed)
Echocardiogram 2D Echocardiogram has been performed.  Dorothey BasemanReel, Icarus Partch M 09/25/2017, 3:59 PM

## 2017-09-26 DIAGNOSIS — I10 Essential (primary) hypertension: Secondary | ICD-10-CM | POA: Diagnosis not present

## 2017-09-26 DIAGNOSIS — Z79899 Other long term (current) drug therapy: Secondary | ICD-10-CM | POA: Diagnosis not present

## 2017-09-26 DIAGNOSIS — Z954 Presence of other heart-valve replacement: Secondary | ICD-10-CM | POA: Diagnosis not present

## 2017-09-26 DIAGNOSIS — Z7982 Long term (current) use of aspirin: Secondary | ICD-10-CM | POA: Diagnosis not present

## 2017-09-29 DIAGNOSIS — Z79899 Other long term (current) drug therapy: Secondary | ICD-10-CM | POA: Diagnosis not present

## 2017-09-29 DIAGNOSIS — Z7982 Long term (current) use of aspirin: Secondary | ICD-10-CM | POA: Diagnosis not present

## 2017-09-29 DIAGNOSIS — I10 Essential (primary) hypertension: Secondary | ICD-10-CM | POA: Diagnosis not present

## 2017-09-29 DIAGNOSIS — Z954 Presence of other heart-valve replacement: Secondary | ICD-10-CM | POA: Diagnosis not present

## 2017-10-01 DIAGNOSIS — I10 Essential (primary) hypertension: Secondary | ICD-10-CM | POA: Diagnosis not present

## 2017-10-01 DIAGNOSIS — Z954 Presence of other heart-valve replacement: Secondary | ICD-10-CM | POA: Diagnosis not present

## 2017-10-01 DIAGNOSIS — Z7982 Long term (current) use of aspirin: Secondary | ICD-10-CM | POA: Diagnosis not present

## 2017-10-01 DIAGNOSIS — Z79899 Other long term (current) drug therapy: Secondary | ICD-10-CM | POA: Diagnosis not present

## 2017-10-06 DIAGNOSIS — Z954 Presence of other heart-valve replacement: Secondary | ICD-10-CM | POA: Diagnosis not present

## 2017-10-06 DIAGNOSIS — Z79899 Other long term (current) drug therapy: Secondary | ICD-10-CM | POA: Diagnosis not present

## 2017-10-06 DIAGNOSIS — I10 Essential (primary) hypertension: Secondary | ICD-10-CM | POA: Diagnosis not present

## 2017-10-06 DIAGNOSIS — Z7982 Long term (current) use of aspirin: Secondary | ICD-10-CM | POA: Diagnosis not present

## 2017-10-13 DIAGNOSIS — Z79899 Other long term (current) drug therapy: Secondary | ICD-10-CM | POA: Diagnosis not present

## 2017-10-13 DIAGNOSIS — Z954 Presence of other heart-valve replacement: Secondary | ICD-10-CM | POA: Diagnosis not present

## 2017-10-13 DIAGNOSIS — Z7982 Long term (current) use of aspirin: Secondary | ICD-10-CM | POA: Diagnosis not present

## 2017-10-13 DIAGNOSIS — I10 Essential (primary) hypertension: Secondary | ICD-10-CM | POA: Diagnosis not present

## 2017-10-15 DIAGNOSIS — Z7982 Long term (current) use of aspirin: Secondary | ICD-10-CM | POA: Diagnosis not present

## 2017-10-15 DIAGNOSIS — Z954 Presence of other heart-valve replacement: Secondary | ICD-10-CM | POA: Diagnosis not present

## 2017-10-15 DIAGNOSIS — Z79899 Other long term (current) drug therapy: Secondary | ICD-10-CM | POA: Diagnosis not present

## 2017-10-15 DIAGNOSIS — I10 Essential (primary) hypertension: Secondary | ICD-10-CM | POA: Diagnosis not present

## 2017-10-17 DIAGNOSIS — Z954 Presence of other heart-valve replacement: Secondary | ICD-10-CM | POA: Diagnosis not present

## 2017-10-17 DIAGNOSIS — Z7982 Long term (current) use of aspirin: Secondary | ICD-10-CM | POA: Diagnosis not present

## 2017-10-17 DIAGNOSIS — I10 Essential (primary) hypertension: Secondary | ICD-10-CM | POA: Diagnosis not present

## 2017-10-17 DIAGNOSIS — Z79899 Other long term (current) drug therapy: Secondary | ICD-10-CM | POA: Diagnosis not present

## 2017-10-23 DIAGNOSIS — L400 Psoriasis vulgaris: Secondary | ICD-10-CM | POA: Diagnosis not present

## 2017-10-23 DIAGNOSIS — L4050 Arthropathic psoriasis, unspecified: Secondary | ICD-10-CM | POA: Diagnosis not present

## 2017-10-24 DIAGNOSIS — Z79899 Other long term (current) drug therapy: Secondary | ICD-10-CM | POA: Diagnosis not present

## 2017-10-24 DIAGNOSIS — I10 Essential (primary) hypertension: Secondary | ICD-10-CM | POA: Diagnosis not present

## 2017-10-24 DIAGNOSIS — Z7982 Long term (current) use of aspirin: Secondary | ICD-10-CM | POA: Diagnosis not present

## 2017-10-24 DIAGNOSIS — Z954 Presence of other heart-valve replacement: Secondary | ICD-10-CM | POA: Diagnosis not present

## 2017-10-24 DIAGNOSIS — L400 Psoriasis vulgaris: Secondary | ICD-10-CM | POA: Diagnosis not present

## 2017-10-24 DIAGNOSIS — R531 Weakness: Secondary | ICD-10-CM | POA: Diagnosis not present

## 2017-11-03 DIAGNOSIS — I10 Essential (primary) hypertension: Secondary | ICD-10-CM | POA: Diagnosis not present

## 2017-11-03 DIAGNOSIS — Z954 Presence of other heart-valve replacement: Secondary | ICD-10-CM | POA: Diagnosis not present

## 2017-11-03 DIAGNOSIS — Z79899 Other long term (current) drug therapy: Secondary | ICD-10-CM | POA: Diagnosis not present

## 2017-11-03 DIAGNOSIS — Z7982 Long term (current) use of aspirin: Secondary | ICD-10-CM | POA: Diagnosis not present

## 2017-11-11 ENCOUNTER — Telehealth: Payer: Self-pay | Admitting: Cardiology

## 2017-11-11 NOTE — Telephone Encounter (Signed)
No OTC's for sleep other than melatonin

## 2017-11-11 NOTE — Telephone Encounter (Signed)
Spoke with wife, Misty StanleyLisa, per DPR. Wants to know if there is anything over the counter for sleep that is safe. Patient has tried melatonin and that has been unsuccessful. Patient does have some soreness when he lays on his side. Patient does experience a fluttering from time to time that is a 0.5 on 10 level pain scale. Please advise if any other over the counter medications such as Unisom Sleep or Zzzquil from Nyquil is safe.

## 2017-11-11 NOTE — Telephone Encounter (Signed)
Please call patient's spouse regarding pain meds

## 2017-11-11 NOTE — Telephone Encounter (Signed)
OK for PCP to prescribe

## 2017-11-11 NOTE — Telephone Encounter (Signed)
Advised no over the counter's besides melatonin. Inquired about if patient's PCP were to prescribe something for sleep if that would be okay. Not for Dr. Dulce SellarMunley to prescribe, but the PCP. Please advise.

## 2017-11-12 NOTE — Telephone Encounter (Signed)
Advised Misty StanleyLisa okay if PCP prescribes a medication for sleep. Verbalized understanding, no further questions.

## 2017-11-20 ENCOUNTER — Telehealth: Payer: Self-pay | Admitting: *Deleted

## 2017-11-20 MED ORDER — AMOXICILLIN 500 MG PO TABS: 500.0000 mg | ORAL_TABLET | ORAL | 3 refills | Status: DC | PRN

## 2017-11-20 NOTE — Telephone Encounter (Signed)
Pt requested refill of Amoxicillin to take prior to dental appointments. [facility] and she okayed this. Sent in to pharmacy

## 2018-03-02 NOTE — Progress Notes (Signed)
Cardiology Office Note:    Date:  03/03/2018   ID:  [name], DOB November 18, 1960, MRN [id number]  PCP:  Hadley Penobbins, Robert A, MD  Cardiologist:  Norman HerrlichBrian Sayre Mazor, MD    Referring MD: Hadley Penobbins, Robert A, MD    ASSESSMENT:    1. S/P aortic valve replacement with bioprosthetic valve   2. Essential hypertension   3. Hyperlipidemia, unspecified hyperlipidemia type   4. Fatigue, unspecified type   5. Palpitation    PLAN:    In order of problems listed above:  1. Stable no evidence of valve dysfunction  2.  Home blood pressure readings been running more in the range of 90-110 if he does not have atrial arrhythmia I would stop his beta-blocker with fatigue 3.  Stable check lipids 4.  Potentials include anemia and thyroid dysfunction beta-blocker effect 5.  Evaluate for 14-day extended monitor to exclude atrial arrhythmia before consideration of withdrawal beta-blocker   Next appointment: 6 weeks   Medication Adjustments/Labs and Tests Ordered: Current medicines are reviewed at length with the patient today.  Concerns regarding medicines are outlined above.  Orders Placed This Encounter  Procedures  . Comprehensive Metabolic Panel (CMET)  . CBC with Differential  . Thyroid Panel With TSH  . Lipid Profile  . LONG TERM MONITOR (3-14 DAYS)   No orders of the defined types were placed in this encounter.   Chief Complaint  Patient presents with  . Follow-up    AVR  . Palpitations  . Fatigue    History of Present Illness:    [name] is a 57 y.o. male with a hx of symptomatic aortic stenosis with a bioprosthetic (27 mm Pericardial Ewards) AVR and ascending [facility]  last seen 09/04/17.  Baseline AVR echo 08/28/17 with normal function p/M 18/11/ mm Hg Compliance with diet, lifestyle and medications: Yes  He has 2 complaints one is fatigue and fortunately he stopped exercising.  He has had no fever chills and baseline echocardiogram  after valve replacement showed normal prosthetic valve function.  His other complaint is palpitations at nighttime and uses a pulse meter and his pulse was run in the range of 110-115  Prior to consideration of discontinuing his beta-blocker utilize a 2-week extended heart rhythm monitor to exclude postoperative atrial arrhythmias. Past Medical History:  Diagnosis Date  . Anemia 04/03/2017  . Asthma 04/03/2017  . Bicuspid aortic valve 04/06/2016  . Diverticulosis 04/03/2017  . 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 Essential hypertension 04/06/2016  . Hyperlipidemia 04/06/2016  . Mild aortic stenosis 04/06/2016   Overview:  Mild at last FU 2015  . Obstructive sleep apnea 04/03/2017  . Psoriasis 04/03/2017    Past Surgical History:  Procedure Laterality Date  . HERNIA REPAIR    . LITHOTRIPSY    . RIGHT/LEFT HEART CATH AND CORONARY ANGIOGRAPHY N/A 04/24/2017   Procedure: RIGHT/LEFT HEART CATH AND CORONARY ANGIOGRAPHY;  Surgeon: Lyn RecordsSmith, Henry W, MD;  Location: [facility] INVASIVE CV LAB;  Service: Cardiovascular;  Laterality: N/A;    Current Medications: Current Meds  Medication Sig  . albuterol (PROVENTIL HFA;VENTOLIN HFA) 108 (90 Base) MCG/ACT inhaler Inhale 2 puffs into the lungs every 4 (four) hours as needed for wheezing.  Marland Kitchen. amoxicillin (AMOXIL) 500 MG tablet Take 1 tablet (500 mg total) by mouth as needed. Take 4 capsules 1 hour prior to dental appointment  . aspirin EC 81 MG tablet Take  81 mg by mouth daily.  . Cholecalciferol (VITAMIN D) 2000 units CAPS Take 2,000 Units by mouth daily.   Marland Kitchen LORazepam (ATIVAN) 1 MG tablet Take 1 tablet (1 mg total) by mouth 2 (two) times daily as needed for anxiety.  Marland Kitchen losartan (COZAAR) 100 MG tablet Take 1 tablet (100 mg total) by mouth daily. Red=uce to 1/2 for a systolic of < 105  . Metoprolol Succinate 25 MG CS24 Take 25 mg by mouth daily.  . montelukast (SINGULAIR) 10 MG tablet Take 10 mg by mouth daily as needed (allergies).     . nitroGLYCERIN (NITROSTAT) 0.4 MG SL tablet Place 1 tablet (0.4 mg total) under the tongue every 5 (five) minutes as needed for chest pain.  Marland Kitchen ustekinumab (STELARA) 90 MG/ML SOSY injection Inject 90 mg into the skin every 3 (three) months.   . [DISCONTINUED] Multiple Vitamin (MULTIVITAMIN WITH MINERALS) TABS tablet Take 1 tablet by mouth daily.     Allergies:   Sulfamethoxazole   Social History   Socioeconomic History  . Marital status: Married    Spouse name: Not on file  . Number of children: Not on file  . Years of education: Not on file  . Highest education level: Not on file  Occupational History  . Not on file  Social Needs  . Financial resource strain: Not on file  . Food insecurity:    Worry: Not on file    Inability: Not on file  . Transportation needs:    Medical: Not on file    Non-medical: Not on file  Tobacco Use  . Smoking status: Never Smoker  . Smokeless tobacco: Never Used  Substance and Sexual Activity  . Alcohol use: No  . Drug use: No  . Sexual activity: Not on file  Lifestyle  . Physical activity:    Days per week: Not on file    Minutes per session: Not on file  . Stress: Not on file  Relationships  . Social connections:    Talks on phone: Not on file    Gets together: Not on file    Attends religious service: Not on file    Active member of club or organization: Not on file    Attends meetings of clubs or organizations: Not on file    Relationship status: Not on file  Other Topics Concern  . Not on file  Social History Narrative  . Not on file     Family History: The patient's family history includes Cancer in his sister; Diabetes in his father. ROS:   Please see the history of present illness.    All other systems reviewed and are negative.  EKGs/Labs/Other Studies Reviewed:    The following studies were reviewed today:  EKG:  EKG ordered today.  The ekg ordered today demonstrates sinus rhythm normal  Recent Labs: 04/22/2017:  ALT 33; BUN 19; Creatinine, Ser 1.04; Hemoglobin 17.4; Platelets 214; Potassium 4.3; Sodium 139  Recent Lipid Panel No results found for: CHOL, TRIG, HDL, CHOLHDL, VLDL, LDLCALC, LDLDIRECT  Physical Exam:    VS:  BP 132/88 (BP Location: Left Arm, Patient Position: Sitting, Cuff Size: Large)   Pulse 82   Ht 6\' 1"  (1.854 m)   Wt 272 lb 3.2 oz (123.5 kg)   SpO2 98%   BMI 35.91 kg/m     Wt Readings from Last 3 Encounters:  03/03/18 272 lb 3.2 oz (123.5 kg)  09/04/17 263 lb (119.3 kg)  06/02/17 262 lb (118.8 kg)  GEN:  Well nourished, well developed in no acute distress HEENT: Normal NECK: No JVD; No carotid bruits LYMPHATICS: No lymphadenopathy CARDIAC: No murmurRRR, no murmurs, rubs, gallops RESPIRATORY:  Clear to auscultation without rales, wheezing or rhonchi  ABDOMEN: Soft, non-tender, non-distended MUSCULOSKELETAL:  No edema; No deformity  SKIN: Warm and dry NEUROLOGIC:  Alert and oriented x 3 PSYCHIATRIC:  Normal affect    Signed, Norman Herrlich, MD  03/03/2018 12:44 PM    [facility]

## 2018-03-03 ENCOUNTER — Ambulatory Visit (INDEPENDENT_AMBULATORY_CARE_PROVIDER_SITE_OTHER): Payer: BLUE CROSS/BLUE SHIELD | Admitting: Cardiology

## 2018-03-03 VITALS — BP 132/88 | HR 82 | Ht 73.0 in | Wt 272.2 lb

## 2018-03-03 DIAGNOSIS — E785 Hyperlipidemia, unspecified: Secondary | ICD-10-CM

## 2018-03-03 DIAGNOSIS — Z953 Presence of xenogenic heart valve: Secondary | ICD-10-CM | POA: Diagnosis not present

## 2018-03-03 DIAGNOSIS — I10 Essential (primary) hypertension: Secondary | ICD-10-CM | POA: Diagnosis not present

## 2018-03-03 DIAGNOSIS — R002 Palpitations: Secondary | ICD-10-CM | POA: Diagnosis not present

## 2018-03-03 DIAGNOSIS — R5383 Other fatigue: Secondary | ICD-10-CM

## 2018-03-03 NOTE — Patient Instructions (Signed)
Medication Instructions:  Your physician recommends that you continue on your current medications as directed. Please refer to the Current Medication list given to you today.   Labwork: Your physician recommends that you have the following labs drawn: CMP, CBC, TSH   Testing/Procedures: Your physician has recommended that you wear a holter monitor. Holter monitors are medical devices that record the heart's electrical activity. Doctors most often use these monitors to diagnose arrhythmias. Arrhythmias are problems with the speed or rhythm of the heartbeat. The monitor is a small, portable device. You can wear one while you do your normal daily activities. This is usually used to diagnose what is causing palpitations/syncope (passing out).  14 day Long Term Monitor    Follow-Up: Your physician recommends that you schedule a follow-up appointment in: 6 weeks   Any Other Special Instructions Will Be Listed Below (If Applicable).     If you need a refill on your cardiac medications before your next appointment, please call your pharmacy.

## 2018-03-04 LAB — COMPREHENSIVE METABOLIC PANEL
A/G RATIO: 1.6 (ref 1.2–2.2)
ALT: 25 IU/L (ref 0–44)
AST: 18 IU/L (ref 0–40)
Albumin: 4.5 g/dL (ref 3.5–5.5)
Alkaline Phosphatase: 56 IU/L (ref 39–117)
BUN/Creatinine Ratio: 15 (ref 9–20)
BUN: 20 mg/dL (ref 6–24)
Bilirubin Total: 0.4 mg/dL (ref 0.0–1.2)
CALCIUM: 9.9 mg/dL (ref 8.7–10.2)
CO2: 20 mmol/L (ref 20–29)
CREATININE: 1.3 mg/dL — AB (ref 0.76–1.27)
Chloride: 104 mmol/L (ref 96–106)
GFR, EST AFRICAN AMERICAN: 71 mL/min/{1.73_m2} (ref >59)
GFR, EST NON AFRICAN AMERICAN: 61 mL/min/{1.73_m2} (ref >59)
GLUCOSE: 97 mg/dL (ref 65–99)
Globulin, Total: 2.8 g/dL (ref 1.5–4.5)
POTASSIUM: 4.4 mmol/L (ref 3.5–5.2)
Sodium: 140 mmol/L (ref 134–144)
TOTAL PROTEIN: 7.3 g/dL (ref 6.0–8.5)

## 2018-03-04 LAB — CBC WITH DIFFERENTIAL/PLATELET
BASOS: 0 %
Basophils Absolute: 0 10*3/uL (ref 0.0–0.2)
EOS (ABSOLUTE): 0.2 10*3/uL (ref 0.0–0.4)
Eos: 2 %
HEMOGLOBIN: 16.8 g/dL (ref 13.0–17.7)
Hematocrit: 47.4 % (ref 37.5–51.0)
IMMATURE GRANS (ABS): 0 10*3/uL (ref 0.0–0.1)
Immature Granulocytes: 0 %
LYMPHS: 26 %
Lymphocytes Absolute: 1.8 10*3/uL (ref 0.7–3.1)
MCH: 32.8 pg (ref 26.6–33.0)
MCHC: 35.4 g/dL (ref 31.5–35.7)
MCV: 93 fL (ref 79–97)
MONOCYTES: 10 %
Monocytes Absolute: 0.7 10*3/uL (ref 0.1–0.9)
NEUTROS PCT: 62 %
Neutrophils Absolute: 4.2 10*3/uL (ref 1.4–7.0)
PLATELETS: 193 10*3/uL (ref 150–450)
RBC: 5.12 x10E6/uL (ref 4.14–5.80)
RDW: 14.8 % (ref 12.3–15.4)
WBC: 6.8 10*3/uL (ref 3.4–10.8)

## 2018-03-04 LAB — THYROID PANEL WITH TSH
FREE THYROXINE INDEX: 1.8 (ref 1.2–4.9)
T3 UPTAKE RATIO: 27 % (ref 24–39)
T4, Total: 6.8 ug/dL (ref 4.5–12.0)
TSH: 2.65 u[IU]/mL (ref 0.450–4.500)

## 2018-03-13 ENCOUNTER — Ambulatory Visit (INDEPENDENT_AMBULATORY_CARE_PROVIDER_SITE_OTHER): Payer: BLUE CROSS/BLUE SHIELD

## 2018-03-13 DIAGNOSIS — R002 Palpitations: Secondary | ICD-10-CM

## 2018-04-11 ENCOUNTER — Other Ambulatory Visit: Payer: Self-pay | Admitting: Cardiology

## 2018-04-11 DIAGNOSIS — I10 Essential (primary) hypertension: Secondary | ICD-10-CM

## 2018-04-23 DIAGNOSIS — L400 Psoriasis vulgaris: Secondary | ICD-10-CM | POA: Diagnosis not present

## 2018-06-02 ENCOUNTER — Telehealth: Payer: Self-pay | Admitting: *Deleted

## 2018-06-02 NOTE — Telephone Encounter (Signed)
Pt has appt 11/15 but wanted to let Dr. Dulce Sellar know that he has been feeling lethargic for last 1-2 months and sometimes has heaviness in chest. HR seems to go down when he is sitting for long period of time while at work, below 70, into 60's. Was wondering if this is normal and due to the Metoprolol and Valsartin he was Rxd. Please advise.

## 2018-06-03 ENCOUNTER — Other Ambulatory Visit: Payer: Self-pay

## 2018-06-03 DIAGNOSIS — R0789 Other chest pain: Secondary | ICD-10-CM

## 2018-06-03 NOTE — Telephone Encounter (Signed)
Patient had relatively unremarkable monitor in July.   Patient would like to await the opinion of Dr. Dulce Sellar on Monday.

## 2018-06-03 NOTE — Telephone Encounter (Signed)
Left voicemail for the patient to call the office. 

## 2018-06-03 NOTE — Telephone Encounter (Signed)
Patient had CABG last October. Patient is having intermittent chest pressure not always with activity. Patient feels as if his pulse maybe dropping to low. Please advise if this patient should be seen sooner?   [phone] another number provided to contact.

## 2018-06-03 NOTE — Telephone Encounter (Signed)
We can do a plain treadmill stress test and 48-hour Holter monitor.

## 2018-06-04 ENCOUNTER — Other Ambulatory Visit: Payer: Self-pay

## 2018-06-04 DIAGNOSIS — R002 Palpitations: Secondary | ICD-10-CM

## 2018-06-04 DIAGNOSIS — R0789 Other chest pain: Secondary | ICD-10-CM

## 2018-06-04 DIAGNOSIS — I712 Thoracic aortic aneurysm, without rupture, unspecified: Secondary | ICD-10-CM

## 2018-06-04 NOTE — Telephone Encounter (Signed)
Patient will come for monitor and labs tomorrow.

## 2018-06-04 NOTE — Addendum Note (Signed)
Addended by: Craige Cotta on: 06/04/2018 11:20 AM   Modules accepted: Orders

## 2018-06-04 NOTE — Telephone Encounter (Signed)
Order for event monitor and CTA has been placed. Will send to the front for scheduling. Patient understands to come for BMP prior to testing.

## 2018-06-04 NOTE — Telephone Encounter (Signed)
1. Offer an event monitor or can use Kardia with smart phone. His recent monitor was good   2. Chest CTA re thoracic aorta

## 2018-06-05 ENCOUNTER — Ambulatory Visit (INDEPENDENT_AMBULATORY_CARE_PROVIDER_SITE_OTHER): Payer: BLUE CROSS/BLUE SHIELD

## 2018-06-05 DIAGNOSIS — R002 Palpitations: Secondary | ICD-10-CM | POA: Diagnosis not present

## 2018-06-05 DIAGNOSIS — R0789 Other chest pain: Secondary | ICD-10-CM

## 2018-06-05 DIAGNOSIS — I712 Thoracic aortic aneurysm, without rupture, unspecified: Secondary | ICD-10-CM

## 2018-06-06 LAB — BASIC METABOLIC PANEL
BUN / CREAT RATIO: 17 (ref 9–20)
BUN: 20 mg/dL (ref 6–24)
CO2: 21 mmol/L (ref 20–29)
CREATININE: 1.2 mg/dL (ref 0.76–1.27)
Calcium: 10.1 mg/dL (ref 8.7–10.2)
Chloride: 101 mmol/L (ref 96–106)
GFR calc Af Amer: 78 mL/min/{1.73_m2} (ref >59)
GFR, EST NON AFRICAN AMERICAN: 68 mL/min/{1.73_m2} (ref >59)
Glucose: 93 mg/dL (ref 65–99)
Potassium: 4.1 mmol/L (ref 3.5–5.2)
SODIUM: 138 mmol/L (ref 134–144)

## 2018-06-09 DIAGNOSIS — I7789 Other specified disorders of arteries and arterioles: Secondary | ICD-10-CM | POA: Diagnosis not present

## 2018-06-09 DIAGNOSIS — Z952 Presence of prosthetic heart valve: Secondary | ICD-10-CM | POA: Diagnosis not present

## 2018-06-09 DIAGNOSIS — I712 Thoracic aortic aneurysm, without rupture: Secondary | ICD-10-CM | POA: Diagnosis not present

## 2018-06-10 ENCOUNTER — Other Ambulatory Visit: Payer: Self-pay

## 2018-06-10 ENCOUNTER — Other Ambulatory Visit: Payer: Self-pay | Admitting: Cardiology

## 2018-06-10 DIAGNOSIS — I712 Thoracic aortic aneurysm, without rupture, unspecified: Secondary | ICD-10-CM

## 2018-07-08 ENCOUNTER — Telehealth: Payer: Self-pay

## 2018-07-08 NOTE — Telephone Encounter (Signed)
Patient called and informed of test results.

## 2018-07-08 NOTE — Telephone Encounter (Signed)
-----   Message from Garwin Brothersajan R Revankar, MD sent at 07/07/2018  4:56 PM EST ----- The results of the study is unremarkable. Please inform patient. I will discuss in detail at next appointment. Cc  primary care/referring physician Garwin Brothersajan R Revankar, MD 07/07/2018 4:56 PM

## 2018-07-09 NOTE — Progress Notes (Signed)
Cardiology Office Note:    Date:  07/10/2018   ID:  [name], DOB 04/15/1961, MRN [id number]  PCP:  [name], MD  Cardiologist:  Norman Herrlich, MD    Referring MD: [name], MD    ASSESSMENT:    1. S/P aortic valve replacement with bioprosthetic valve   2. Mild CAD   3. Essential hypertension   4. Hyperlipidemia, unspecified hyperlipidemia type    PLAN:    In order of problems listed above:  1. Stable clinically, he is having ongoing symptoms of palpitation unrelated to arrhythmia fatigue may or may not be related to his beta-blocker and very atypical chest discomfort nonanginal.  At this time I do not think he needs further cardiac testing if his symptoms were more typical cardiac CTA or angiography be appropriate 2. Start a statin 3. Stable continue current treatment ARB home blood pressures run in the range of 120/80 4. Initiate a statin.  Concerned of toxicity and because that when she was on low-dose low intensity statin and is not on target we could consider adding a second agent like Zetia   Next appointment: 6 months   Medication Adjustments/Labs and Tests Ordered: Current medicines are reviewed at length with the patient today.  Concerns regarding medicines are outlined above.  No orders of the defined types were placed in this encounter.  No orders of the defined types were placed in this encounter.   Chief Complaint  Patient presents with  . Follow-up    after AVR  . Fatigue  . Hyperlipidemia  . Chest Pain    atypical    History of Present Illness:    [name] is a 57 y.o. male with a hx of symptomatic aortic stenosis with a bioprosthetic (27 mm Pericardial Ewards) AVR and ascending aorta graft on 06/23/17 at [facility] Dr [name] .  Last seen 03/03/2018.  At that time his complaint was of rapid heart rhythm.  He used a 14-day event monitor which was unremarkable showing no significant arrhythmia and failure to associated  symptoms with arrhythmia.  Subsequently he  phoned our office complaints of chest pain and underwent CTA of the thoracic aorta which showed no abnormality postoperatively. Compliance with diet, lifestyle and medications: yes  Is seen in follow-up after several office and phone initiated interactions.  Follow-up CT of the chest was good no evidence of any problem with the thoracic aortic disease residual aneurysm or complications of surgery.  He does not feel well he still has episodes of palpitation but his pulse is regular.  He wore an event monitor and there is no association of symptoms with arrhythmia he complains of fatigue he stopped exercising and they think his beta-blocker may be the problem will decrease to every other day for we can discontinue it.  He is having a discomfort very mild in his left axillary area unrelated to physical effort better when he does activities that is fleeting comes and goes.  It is not anginal in nature reviewed his records he had mild nonobstructive disease in the right coronary artery and was never put on a statin I think because of the back and forth between our healthcare system [facility] and then back after surgery.  I reviewed this finding with patient his wife the concern of side effects and will place him on a low-dose of a low intensity statin and recheck his lipid profile is baseline along with liver function and a TSH with fatigue.  He has had no fever or chills no anginal discomfort shortness of breath Past Medical History:  Diagnosis Date  . Anemia 04/03/2017  . Asthma 04/03/2017  . Bicuspid aortic valve 04/06/2016  . Diverticulosis 04/03/2017  . 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 Essential hypertension 04/06/2016  . Hyperlipidemia 04/06/2016  . Mild aortic stenosis 04/06/2016   Overview:  Mild at last FU 2015  . Obstructive sleep apnea 04/03/2017  . Psoriasis 04/03/2017    Past Surgical History:  Procedure Laterality Date    . HERNIA REPAIR    . LITHOTRIPSY    . RIGHT/LEFT HEART CATH AND CORONARY ANGIOGRAPHY N/A 04/24/2017   Procedure: RIGHT/LEFT HEART CATH AND CORONARY ANGIOGRAPHY;  Surgeon: Lyn Records, MD;  Location: [facility];  Service: Cardiovascular;  Laterality: N/A;    Current Medications: Current Meds  Medication Sig  . albuterol (PROVENTIL HFA;VENTOLIN HFA) 108 (90 Base) MCG/ACT inhaler Inhale 2 puffs into the lungs every 4 (four) hours as needed for wheezing.  Marland Kitchen amoxicillin (AMOXIL) 500 MG tablet Take 1 tablet (500 mg total) by mouth as needed. Take 4 capsules 1 hour prior to dental appointment  . aspirin EC 81 MG tablet Take 81 mg by mouth daily.  . Cholecalciferol (VITAMIN D) 2000 units CAPS Take 2,000 Units by mouth daily.   Marland Kitchen LORazepam (ATIVAN) 1 MG tablet Take 1 tablet (1 mg total) by mouth 2 (two) times daily as needed for anxiety.  Marland Kitchen losartan (COZAAR) 100 MG tablet TAKE ONE TABLET BY MOUTH ONCE DAILY. REDUCE TO 1/2 TABLET FOR SYSTOLIC OF <105.  . Metoprolol Succinate 25 MG CS24 Take 25 mg by mouth daily.  . montelukast (SINGULAIR) 10 MG tablet Take 10 mg by mouth daily as needed (allergies).   . nitroGLYCERIN (NITROSTAT) 0.4 MG SL tablet Place 1 tablet (0.4 mg total) under the tongue every 5 (five) minutes as needed for chest pain.     Allergies:   Sulfamethoxazole   Social History   Socioeconomic History  . Marital status: Married    Spouse name: Not on file  . Number of children: Not on file  . Years of education: Not on file  . Highest education level: Not on file  Occupational History  . Not on file  Social Needs  . Financial resource strain: Not on file  . Food insecurity:    Worry: Not on file    Inability: Not on file  . Transportation needs:    Medical: Not on file    Non-medical: Not on file  Tobacco Use  . Smoking status: Never Smoker  . Smokeless tobacco: Never Used  Substance and Sexual Activity  . Alcohol use: No  . Drug use: No  . Sexual activity:  Not on file  Lifestyle  . Physical activity:    Days per week: Not on file    Minutes per session: Not on file  . Stress: Not on file  Relationships  . Social connections:    Talks on phone: Not on file    Gets together: Not on file    Attends religious service: Not on file    Active member of club or organization: Not on file    Attends meetings of clubs or organizations: Not on file    Relationship status: Not on file  Other Topics Concern  . Not on file  Social History Narrative  . Not on file     Family History:  The patient's family history includes Cancer in his sister; Diabetes in his father. ROS:   Please see the history of present illness.    All other systems reviewed and are negative.  EKGs/Labs/Other Studies Reviewed:    The following studies were reviewed today:  EKG:  EKG ordered today.  The ekg ordered today demonstrates sinus rhythm and is normal  Recent Labs: 03/03/2018: ALT 25; Hemoglobin 16.8; Platelets 193; TSH 2.650 06/05/2018: BUN 20; Creatinine, Ser 1.20; Potassium 4.1; Sodium 138  Recent Lipid Panel   02/28/17 Chol 196 HDL 42 LDL 144 No results found for: CHOL, TRIG, HDL, CHOLHDL, VLDL, LDLCALC, LDLDIRECT  Physical Exam:    VS:  Pulse 78   Ht 6\' 1"  (1.854 m)   Wt 285 lb 3.2 oz (129.4 kg)   SpO2 98%   BMI 37.63 kg/m     Wt Readings from Last 3 Encounters:  07/10/18 285 lb 3.2 oz (129.4 kg)  03/03/18 272 lb 3.2 oz (123.5 kg)  09/04/17 263 lb (119.3 kg)     GEN:  Well nourished, well developed in no acute distress HEENT: Normal NECK: No JVD; No carotid bruits LYMPHATICS: No lymphadenopathy CARDIAC: RRR, no murmurs, rubs, gallops RESPIRATORY:  Clear to auscultation without rales, wheezing or rhonchi  ABDOMEN: Soft, non-tender, non-distended MUSCULOSKELETAL:  No edema; No deformity  SKIN: Warm and dry NEUROLOGIC:  Alert and oriented x 3 PSYCHIATRIC:  Normal affect    Signed, Norman HerrlichBrian Munley, MD  07/10/2018 8:31 AM      [facility]

## 2018-07-10 ENCOUNTER — Ambulatory Visit (INDEPENDENT_AMBULATORY_CARE_PROVIDER_SITE_OTHER): Payer: BLUE CROSS/BLUE SHIELD | Admitting: Cardiology

## 2018-07-10 ENCOUNTER — Encounter: Payer: Self-pay | Admitting: Cardiology

## 2018-07-10 VITALS — BP 136/90 | HR 78 | Ht 73.0 in | Wt 285.2 lb

## 2018-07-10 DIAGNOSIS — Z953 Presence of xenogenic heart valve: Secondary | ICD-10-CM

## 2018-07-10 DIAGNOSIS — E785 Hyperlipidemia, unspecified: Secondary | ICD-10-CM

## 2018-07-10 DIAGNOSIS — I10 Essential (primary) hypertension: Secondary | ICD-10-CM

## 2018-07-10 DIAGNOSIS — I2510 Atherosclerotic heart disease of native coronary artery without angina pectoris: Secondary | ICD-10-CM

## 2018-07-10 DIAGNOSIS — R002 Palpitations: Secondary | ICD-10-CM | POA: Diagnosis not present

## 2018-07-10 HISTORY — DX: Atherosclerotic heart disease of native coronary artery without angina pectoris: I25.10

## 2018-07-10 LAB — COMPREHENSIVE METABOLIC PANEL
ALT: 23 IU/L (ref 0–44)
AST: 16 IU/L (ref 0–40)
Albumin/Globulin Ratio: 1.8 (ref 1.2–2.2)
Albumin: 4.6 g/dL (ref 3.5–5.5)
Alkaline Phosphatase: 51 IU/L (ref 39–117)
BUN/Creatinine Ratio: 17 (ref 9–20)
BUN: 22 mg/dL (ref 6–24)
Bilirubin Total: 0.5 mg/dL (ref 0.0–1.2)
CALCIUM: 10.1 mg/dL (ref 8.7–10.2)
CO2: 23 mmol/L (ref 20–29)
CREATININE: 1.26 mg/dL (ref 0.76–1.27)
Chloride: 102 mmol/L (ref 96–106)
GFR calc Af Amer: 74 mL/min/{1.73_m2} (ref >59)
GFR, EST NON AFRICAN AMERICAN: 64 mL/min/{1.73_m2} (ref >59)
GLOBULIN, TOTAL: 2.6 g/dL (ref 1.5–4.5)
GLUCOSE: 79 mg/dL (ref 65–99)
Potassium: 4.6 mmol/L (ref 3.5–5.2)
SODIUM: 140 mmol/L (ref 134–144)
Total Protein: 7.2 g/dL (ref 6.0–8.5)

## 2018-07-10 LAB — LIPID PANEL
CHOLESTEROL TOTAL: 191 mg/dL (ref 100–199)
Chol/HDL Ratio: 4.4 ratio (ref 0.0–5.0)
HDL: 43 mg/dL (ref >39)
LDL CALC: 127 mg/dL — AB (ref 0–99)
Triglycerides: 104 mg/dL (ref 0–149)
VLDL Cholesterol Cal: 21 mg/dL (ref 5–40)

## 2018-07-10 LAB — TSH: TSH: 1.78 u[IU]/mL (ref 0.450–4.500)

## 2018-07-10 MED ORDER — PRAVASTATIN SODIUM 20 MG PO TABS: 20.0000 mg | ORAL_TABLET | Freq: Every evening | ORAL | 6 refills | Status: DC

## 2018-07-10 NOTE — Patient Instructions (Addendum)
Medication Instructions:  Your physician has recommended you make the following change in your medication:   DECREASE metoprolol succinate 25 mg: Take 1 tablet every other day for 1 week then STOP this medication  START pravastatin (pravachol) 20 mg: Take 1 tablet daily   If you need a refill on your cardiac medications before your next appointment, please call your pharmacy.   Lab work: Your physician recommends that you return for lab work today: CMP, TSH, lipid panel.   If you have labs (blood work) drawn today and your tests are completely normal, you will receive your results only by: Marland Kitchen. MyChart Message (if you have MyChart) OR . A paper copy in the mail If you have any lab test that is abnormal or we need to change your treatment, we will call you to review the results.  Testing/Procedures: You had an EKG today.   Follow-Up: At [facility], you and your health needs are our priority.  As part of our continuing mission to provide you with exceptional heart care, we have created designated Provider Care Teams.  These Care Teams include your primary Cardiologist (physician) and Advanced Practice Providers (APPs -  Physician Assistants and Nurse Practitioners) who all work together to provide you with the care you need, when you need it. You will need a follow up appointment in 6 months.  Please call our office 2 months in advance to schedule this appointment.      Pravastatin tablets What is this medicine? PRAVASTATIN (PRA va stat in) is known as a HMG-CoA reductase inhibitor or 'statin'. It lowers the level of cholesterol and triglycerides in the blood. This drug may also reduce the risk of heart attack, stroke, or other health problems in patients with risk factors for heart disease. Diet and lifestyle changes are often used with this drug. This medicine may be used for other purposes; ask your health care provider or pharmacist if you have questions. COMMON BRAND NAME(S):  Pravachol What should I tell my health care provider before I take this medicine? They need to know if you have any of these conditions: -frequently drink alcoholic beverages -kidney disease -liver disease -muscle aches or weakness -other medical condition -an unusual or allergic reaction to pravastatin, other medicines, foods, dyes, or preservatives -pregnant or trying to get pregnant -breast-feeding How should I use this medicine? Take pravastatin tablets by mouth. Swallow the tablets with a drink of water. Pravastatin can be taken at anytime of the day, with or without food. Follow the directions on the prescription label. Take your doses at regular intervals. Do not take your medicine more often than directed. Talk to your pediatrician regarding the use of this medicine in children. Special care may be needed. Pravastatin has been used in children as young as 278 years of age. Overdosage: If you think you have taken too much of this medicine contact a poison control center or emergency room at once. NOTE: This medicine is only for you. Do not share this medicine with others. What if I miss a dose? If you miss a dose, take it as soon as you can. If it is almost time for your next dose, take only that dose. Do not take double or extra doses. What may interact with this medicine? This medicine may interact with the following medications: -colchicine -cyclosporine -other medicines for high cholesterol -some antibiotics like azithromycin, clarithromycin, erythromycin, and telithromycin This list may not describe all possible interactions. Give your health care provider a list  of all the medicines, herbs, non-prescription drugs, or dietary supplements you use. Also tell them if you smoke, drink alcohol, or use illegal drugs. Some items may interact with your medicine. What should I watch for while using this medicine? Visit your doctor or health care professional for regular check-ups. You may  need regular tests to make sure your liver is working properly. Tell your doctor or health care professional right away if you get any unexplained muscle pain, tenderness, or weakness, especially if you also have a fever and tiredness. Your doctor or health care professional may tell you to stop taking this medicine if you develop muscle problems. If your muscle problems do not go away after stopping this medicine, contact your health care professional. This drug is only part of a total heart-health program. Your doctor or a dietician can suggest a low-cholesterol and low-fat diet to help. Avoid alcohol and smoking, and keep a proper exercise schedule. Do not use this drug if you are pregnant or breast-feeding. Serious side effects to an unborn child or to an infant are possible. Talk to your doctor or pharmacist for more information. This medicine may affect blood sugar levels. If you have diabetes, check with your doctor or health care professional before you change your diet or the dose of your diabetic medicine. If you are going to have surgery tell your health care professional that you are taking this drug. What side effects may I notice from receiving this medicine? Side effects that you should report to your doctor or health care professional as soon as possible: -allergic reactions like skin rash, itching or hives, swelling of the face, lips, or tongue -dark urine -fever -muscle pain, cramps, or weakness -redness, blistering, peeling or loosening of the skin, including inside the mouth -trouble passing urine or change in the amount of urine -unusually weak or tired -yellowing of the eyes or skin Side effects that usually do not require medical attention (report to your doctor or health care professional if they continue or are bothersome): -gas -headache -heartburn -indigestion -stomach pain This list may not describe all possible side effects. Call your doctor for medical advice about  side effects. You may report side effects to FDA at 1-800-FDA-1088. Where should I keep my medicine? Keep out of the reach of children. Store at room temperature between 15 to 30 degrees C (59 to 86 degrees F). Protect from light. Keep container tightly closed. Throw away any unused medicine after the expiration date. NOTE: This sheet is a summary. It may not cover all possible information. If you have questions about this medicine, talk to your doctor, pharmacist, or health care provider.  2018 Elsevier/Gold Standard (2015-03-09 16:00:27)

## 2018-07-14 ENCOUNTER — Encounter: Payer: Self-pay | Admitting: *Deleted

## 2018-07-27 DIAGNOSIS — L400 Psoriasis vulgaris: Secondary | ICD-10-CM | POA: Diagnosis not present

## 2018-10-27 DIAGNOSIS — L4050 Arthropathic psoriasis, unspecified: Secondary | ICD-10-CM | POA: Diagnosis not present

## 2018-10-27 DIAGNOSIS — R531 Weakness: Secondary | ICD-10-CM | POA: Diagnosis not present

## 2018-10-27 DIAGNOSIS — L400 Psoriasis vulgaris: Secondary | ICD-10-CM | POA: Diagnosis not present

## 2018-11-16 ENCOUNTER — Other Ambulatory Visit: Payer: Self-pay

## 2018-11-16 DIAGNOSIS — I10 Essential (primary) hypertension: Secondary | ICD-10-CM

## 2018-11-16 MED ORDER — LOSARTAN POTASSIUM 100 MG PO TABS: ORAL_TABLET | ORAL | 2 refills | Status: DC

## 2018-11-16 MED ORDER — PRAVASTATIN SODIUM 20 MG PO TABS: 20.0000 mg | ORAL_TABLET | Freq: Every evening | ORAL | 1 refills | Status: DC

## 2019-02-23 ENCOUNTER — Other Ambulatory Visit: Payer: Self-pay

## 2019-02-23 MED ORDER — AMOXICILLIN 500 MG PO TABS: 500.0000 mg | ORAL_TABLET | ORAL | 3 refills | Status: DC | PRN

## 2019-02-23 NOTE — Telephone Encounter (Signed)
Patient called requesting refill of amoxicillin prior to dental procedures.  Rx sent to pharmacy as requested.

## 2019-03-13 DIAGNOSIS — L400 Psoriasis vulgaris: Secondary | ICD-10-CM | POA: Diagnosis not present

## 2019-04-27 ENCOUNTER — Other Ambulatory Visit: Payer: Self-pay | Admitting: Cardiology

## 2019-05-29 ENCOUNTER — Other Ambulatory Visit: Payer: Self-pay | Admitting: Cardiology

## 2019-07-08 ENCOUNTER — Other Ambulatory Visit: Payer: Self-pay

## 2019-07-08 ENCOUNTER — Ambulatory Visit (INDEPENDENT_AMBULATORY_CARE_PROVIDER_SITE_OTHER): Payer: BC Managed Care – PPO | Admitting: Cardiology

## 2019-07-08 ENCOUNTER — Encounter: Payer: Self-pay | Admitting: Cardiology

## 2019-07-08 VITALS — BP 138/84 | HR 97 | Ht 73.0 in | Wt 285.8 lb

## 2019-07-08 DIAGNOSIS — I10 Essential (primary) hypertension: Secondary | ICD-10-CM | POA: Diagnosis not present

## 2019-07-08 DIAGNOSIS — E782 Mixed hyperlipidemia: Secondary | ICD-10-CM

## 2019-07-08 DIAGNOSIS — Z953 Presence of xenogenic heart valve: Secondary | ICD-10-CM | POA: Diagnosis not present

## 2019-07-08 DIAGNOSIS — I7789 Other specified disorders of arteries and arterioles: Secondary | ICD-10-CM

## 2019-07-08 DIAGNOSIS — Q231 Congenital insufficiency of aortic valve: Secondary | ICD-10-CM

## 2019-07-08 LAB — LIPID PANEL
Chol/HDL Ratio: 3.1 ratio (ref 0.0–5.0)
Cholesterol, Total: 133 mg/dL (ref 100–199)
HDL: 43 mg/dL (ref >39)
LDL Chol Calc (NIH): 73 mg/dL (ref 0–99)
Triglycerides: 88 mg/dL (ref 0–149)
VLDL Cholesterol Cal: 17 mg/dL (ref 5–40)

## 2019-07-08 LAB — COMPREHENSIVE METABOLIC PANEL
ALT: 37 IU/L (ref 0–44)
AST: 24 IU/L (ref 0–40)
Albumin/Globulin Ratio: 1.9 (ref 1.2–2.2)
Albumin: 4.5 g/dL (ref 3.8–4.9)
Alkaline Phosphatase: 56 IU/L (ref 39–117)
BUN/Creatinine Ratio: 16 (ref 9–20)
BUN: 19 mg/dL (ref 6–24)
Bilirubin Total: 0.6 mg/dL (ref 0.0–1.2)
CO2: 22 mmol/L (ref 20–29)
Calcium: 10.2 mg/dL (ref 8.7–10.2)
Chloride: 105 mmol/L (ref 96–106)
Creatinine, Ser: 1.2 mg/dL (ref 0.76–1.27)
GFR calc Af Amer: 78 mL/min/{1.73_m2} (ref >59)
GFR calc non Af Amer: 67 mL/min/{1.73_m2} (ref >59)
Globulin, Total: 2.4 g/dL (ref 1.5–4.5)
Glucose: 100 mg/dL — ABNORMAL HIGH (ref 65–99)
Potassium: 4.2 mmol/L (ref 3.5–5.2)
Sodium: 140 mmol/L (ref 134–144)
Total Protein: 6.9 g/dL (ref 6.0–8.5)

## 2019-07-08 LAB — CBC
Hematocrit: 45.4 % (ref 37.5–51.0)
Hemoglobin: 16.1 g/dL (ref 13.0–17.7)
MCH: 32.5 pg (ref 26.6–33.0)
MCHC: 35.5 g/dL (ref 31.5–35.7)
MCV: 92 fL (ref 79–97)
Platelets: 200 10*3/uL (ref 150–450)
RBC: 4.95 x10E6/uL (ref 4.14–5.80)
RDW: 13.1 % (ref 11.6–15.4)
WBC: 7.1 10*3/uL (ref 3.4–10.8)

## 2019-07-08 LAB — TSH: TSH: 1.87 u[IU]/mL (ref 0.450–4.500)

## 2019-07-08 NOTE — Progress Notes (Signed)
Cardiology Office Note:    Date:  07/08/2019   ID:  [name], [place] May 24, 1961, MRN [id number]  PCP:  Myrlene Broker, MD  Cardiologist:  Shirlee More, MD    Referring MD: Myrlene Broker, MD    ASSESSMENT:    1. S/P aortic valve replacement with bioprosthetic valve   2. Essential hypertension   3. Mixed hyperlipidemia   4. 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)   5. Bicuspid aortic valve    PLAN:    In order of problems listed above:  1. Stable no evidence of valve dysfunction clinically we will plan a recheck at 5 years after surgery in the absence of clinical indication he will continue antiplatelet therapy offered him and his wife reassurance 2. Stable BP at target continue treatment with ARB check renal function potassium 3. At his request will withdraw his pravastatin he is having a flare of psoriasis and joint symptoms wonder if the statins plan a role at this time think is best to stop. 4. Stable after surgical aortic valve replacement and grafting of his aortic root.   Next appointment: 6 months   Medication Adjustments/Labs and Tests Ordered: Current medicines are reviewed at length with the patient today.  Concerns regarding medicines are outlined above.  Orders Placed This Encounter  Procedures   Comp Met (CMET)   CBC   Lipid Profile   TSH   EKG 12-Lead   No orders of the defined types were placed in this encounter.   Chief Complaint  Patient presents with   Aortic Stenosis    with SAVR    History of Present Illness:    [name] is a 58 y.o. male with a hx of symptomatic aortic stenosis with a bioprosthetic (27 mm Pericardial Ewards) AVR and ascending aorta graft on 06/23/17 at [facility] Dr Evelina Dun  last seen 07/10/2018. Compliance with diet, lifestyle and medications: Yes EVENT MONITOR REPORT: Patient was monitored from 06/05/2018 11 9 to 2019. Indication:                    Palpitations Ordering physician:  Jenean Lindau, MD    Referring physician:        Jenean Lindau, MD  Baseline rhythm: Sinus rhythm  Minimum heart rate: 50 BPM.  maximal heart rate 149 BPM. Atrial arrhythmia: None significant  Ventricular arrhythmia: None significant Conduction abnormality: None significant Symptoms: Patient complained of chest pain at times  Conclusion:  Uneventful and unremarkable monitoring.  Echo 09/25/2017: - Normal LVEF.   Moderat LVH.   Bioprostetic AV with normal function ( Peak/mean gradient: 18/11 mmHg)   Trace MR.  From a cardiac perspective he has done well palpitation is resolved no chest pain shortness of breath or syncope he is having a severe flare of his psoriasis and has had a little bit of dependent edema is unsure the mechanism he has associated arthropathy wonders of a statin as a problem and we decided to discontinue pravastatin.  He request me to check lab work today and send a copy to his primary care physician.  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[place]) 04/06/2016   Overview:  39 mm aortic root   Essential hypertension 04/06/2016   Hyperlipidemia 04/06/2016   Mild aortic stenosis 04/06/2016  Overview:  Mild at last FU 2015   Obstructive sleep apnea 04/03/2017   Psoriasis 04/03/2017    Past Surgical History:  Procedure Laterality Date   HERNIA REPAIR     LITHOTRIPSY     RIGHT/LEFT HEART CATH AND CORONARY ANGIOGRAPHY N/A 04/24/2017   Procedure: RIGHT/LEFT HEART CATH AND CORONARY ANGIOGRAPHY;  Surgeon: Belva Crome, MD;  Location: [facility];  Service: Cardiovascular;  Laterality: N/A;    Current Medications: Current Meds  Medication Sig   albuterol (PROVENTIL HFA;VENTOLIN HFA) 108 (90 Base) MCG/ACT inhaler Inhale 2 puffs into the lungs every 4 (four) hours as needed for  wheezing.   amoxicillin (AMOXIL) 500 MG tablet Take 1 tablet (500 mg total) by mouth as needed. Take 4 capsules 1 hour prior to dental appointment   aspirin EC 81 MG tablet Take 81 mg by mouth daily.   Cholecalciferol (VITAMIN D) 2000 units CAPS Take 2,000 Units by mouth daily.    Coenzyme Q10 (CO Q 10) 100 MG CAPS Take 1 capsule by mouth daily.   LORazepam (ATIVAN) 1 MG tablet Take 1 tablet (1 mg total) by mouth 2 (two) times daily as needed for anxiety.   losartan (COZAAR) 100 MG tablet TAKE ONE TABLET BY MOUTH ONCE DAILY. REDUCE TO 1/2 TABLET FOR SYSTOLIC OF <382.   montelukast (SINGULAIR) 10 MG tablet Take 10 mg by mouth daily as needed (allergies).    nitroGLYCERIN (NITROSTAT) 0.4 MG SL tablet Place 1 tablet (0.4 mg total) under the tongue every 5 (five) minutes as needed for chest pain.   [DISCONTINUED] pravastatin (PRAVACHOL) 20 MG tablet TAKE 1 TABLET BY MOUTH EVERY DAY IN THE EVENING DUE FOR APPOINTMENT     Allergies:   Sulfamethoxazole   Social History   Socioeconomic History   Marital status: Married    Spouse name: Not on file   Number of children: Not on file   Years of education: Not on file   Highest education level: Not on file  Occupational History   Not on file  Social Needs   Financial resource strain: Not on file   Food insecurity    Worry: Not on file    Inability: Not on file   Transportation needs    Medical: Not on file    Non-medical: Not on file  Tobacco Use   Smoking status: Never Smoker   Smokeless tobacco: Never Used  Substance and Sexual Activity   Alcohol use: No   Drug use: No   Sexual activity: Not on file  Lifestyle   Physical activity    Days per week: Not on file    Minutes per session: Not on file   Stress: Not on file  Relationships   Social connections    Talks on phone: Not on file    Gets together: Not on file    Attends religious service: Not on file    Active member of club or organization: Not on  file    Attends meetings of clubs or organizations: Not on file    Relationship status: Not on file  Other Topics Concern   Not on file  Social History Narrative   Not on file     Family History: The patient's family history includes Cancer in his sister; Diabetes in his father. ROS:   Please see the history of present illness.    All other systems reviewed and are negative.  EKGs/Labs/Other Studies Reviewed:    The following studies were reviewed today:  EKG:  EKG ordered today and personally reviewed.  The ekg ordered today demonstrates sinus rhythm and is normal  Recent Labs: 07/10/2018: ALT 23; BUN 22; Creatinine, Ser 1.26; Potassium 4.6; Sodium 140; TSH 1.780  Recent Lipid Panel    Component Value Date/Time   CHOL 191 07/10/2018 0847   TRIG 104 07/10/2018 0847   HDL 43 07/10/2018 0847   CHOLHDL 4.4 07/10/2018 0847   LDLCALC 127 (H) 07/10/2018 0847    Physical Exam:    VS:  BP (!) 136/98 (BP Location: Right Arm, Patient Position: Sitting, Cuff Size: Large)    Pulse 97    Ht 6' 1"  (1.854 m)    Wt 285 lb 12.8 oz (129.6 kg)    SpO2 97%    BMI 37.71 kg/m     Wt Readings from Last 3 Encounters:  07/08/19 285 lb 12.8 oz (129.6 kg)  07/10/18 285 lb 3.2 oz (129.4 kg)  03/03/18 272 lb 3.2 oz (123.5 kg)     GEN: He has psoriatic plaque diffusely over his body well nourished, well developed in no acute distress HEENT: Normal NECK: No JVD; No carotid bruits LYMPHATICS: No lymphadenopathy CARDIAC: No murmur or click RRR, no murmurs, rubs, gallops RESPIRATORY:  Clear to auscultation without rales, wheezing or rhonchi  ABDOMEN: Soft, non-tender, non-distended MUSCULOSKELETAL: 1+ at the ankle bilateral edema; No deformity  SKIN: Warm and dry NEUROLOGIC:  Alert and oriented x 3 PSYCHIATRIC:  Normal affect    Signed, Shirlee More, MD  07/08/2019 8:58 AM    [place]

## 2019-07-08 NOTE — Patient Instructions (Signed)
Medication Instructions:  Your physician has recommended you make the following change in your medication:   STOP pravastatin   *If you need a refill on your cardiac medications before your next appointment, please call your pharmacy*  Lab Work: Your physician recommends that you return for lab work today: CMP, CBC, lipid panel, TSH.   If you have labs (blood work) drawn today and your tests are completely normal, you will receive your results only by: Marland Kitchen MyChart Message (if you have MyChart) OR . A paper copy in the mail If you have any lab test that is abnormal or we need to change your treatment, we will call you to review the results.  Testing/Procedures: You had an EKG today.   Follow-Up: At [facility], you and your health needs are our priority.  As part of our continuing mission to provide you with exceptional heart care, we have created designated Provider Care Teams.  These Care Teams include your primary Cardiologist (physician) and Advanced Practice Providers (APPs -  Physician Assistants and Nurse Practitioners) who all work together to provide you with the care you need, when you need it.  Your next appointment:   6 months  The format for your next appointment:   In Person  Provider:   Shirlee More, MD

## 2019-07-09 DIAGNOSIS — L400 Psoriasis vulgaris: Secondary | ICD-10-CM | POA: Diagnosis not present

## 2019-07-09 DIAGNOSIS — L299 Pruritus, unspecified: Secondary | ICD-10-CM | POA: Diagnosis not present

## 2019-07-09 DIAGNOSIS — L4050 Arthropathic psoriasis, unspecified: Secondary | ICD-10-CM | POA: Diagnosis not present

## 2019-07-14 IMAGING — DX DG CHEST 2V
2 series · 2 of 2 positions shown · non-contrast
Comparison: None in PACs

CLINICAL DATA: Preoperative exam prior to cardiac catheterization.
History of asthma, bicuspid aortic valve, hypertension, nonsmoker.

EXAM:
CHEST  2 VIEW

[chest pa]
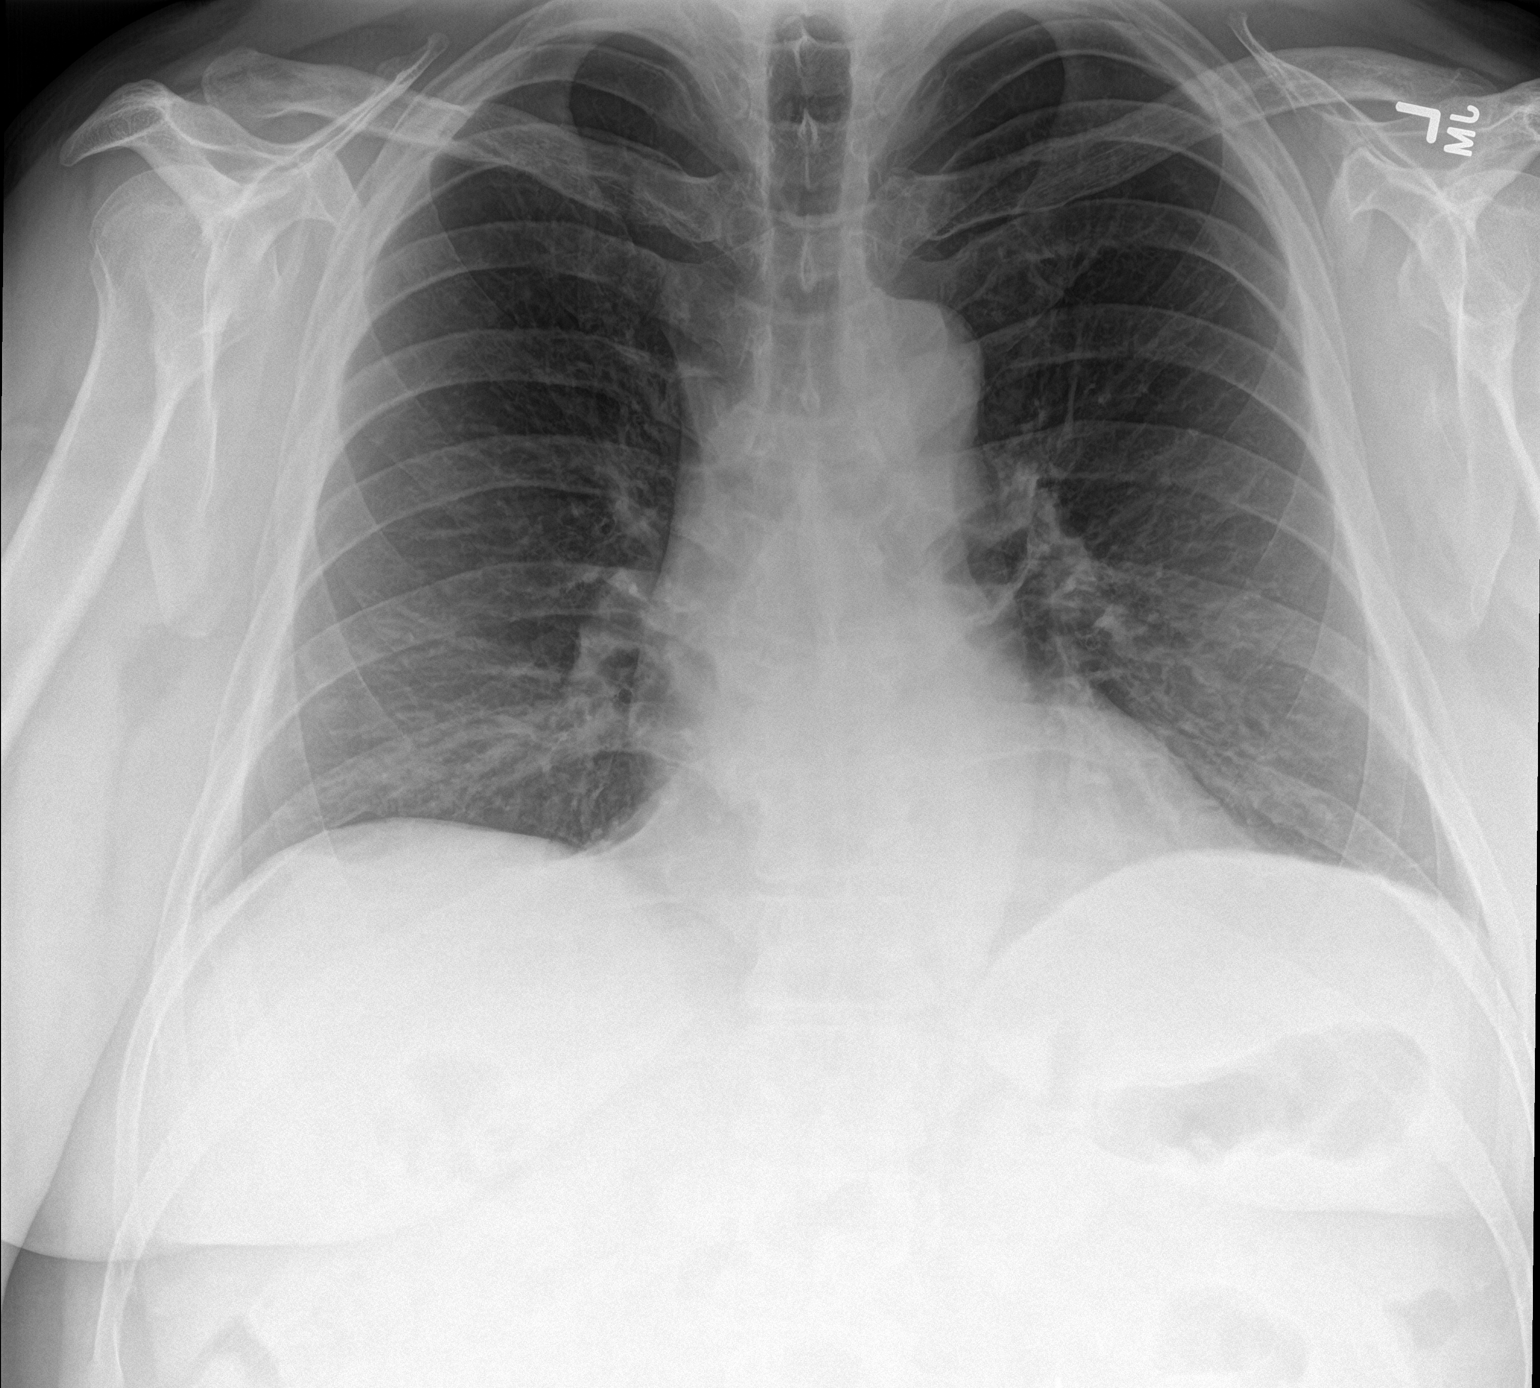

[chest lat]
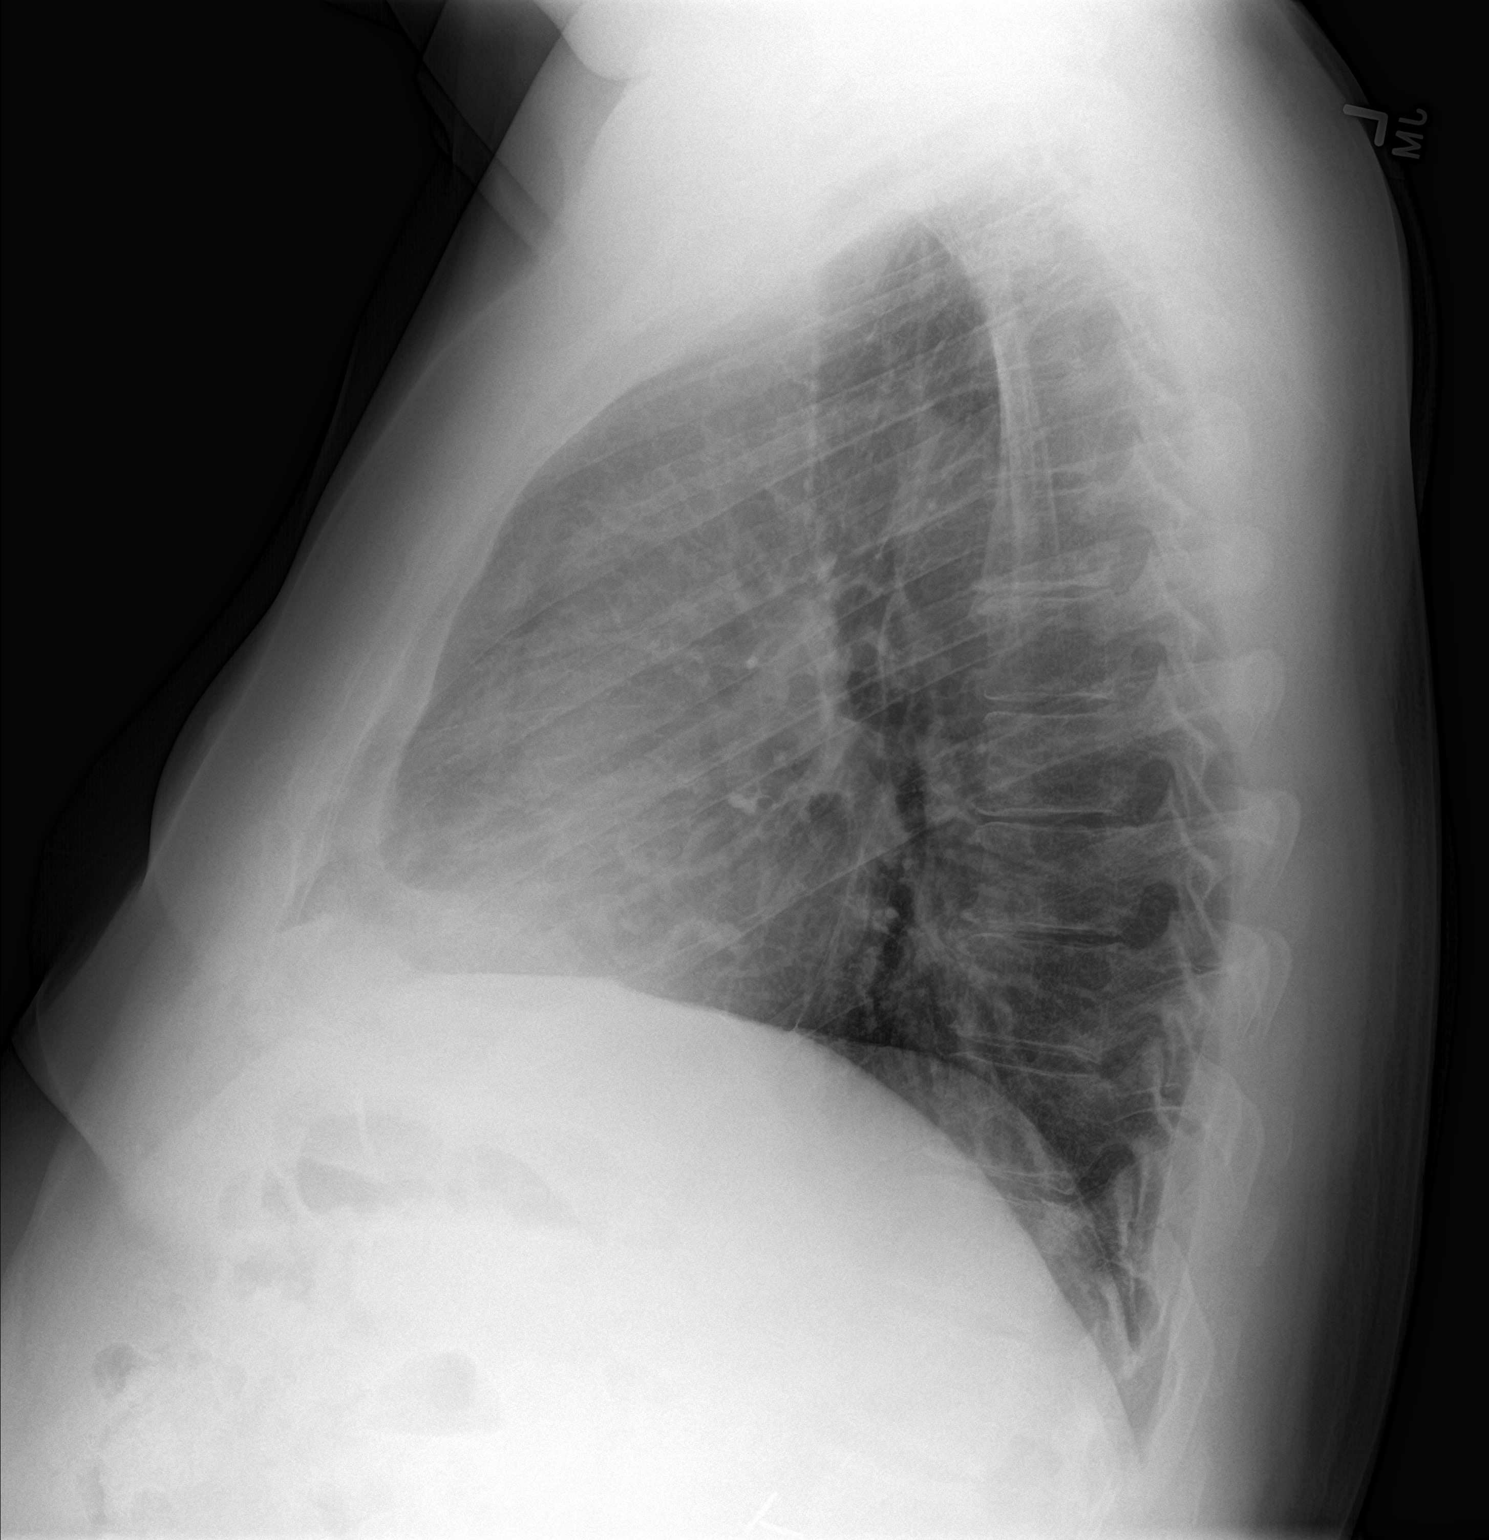

[2 of 2 positions shown; findings below may reference images not displayed]

FINDINGS: The lungs are adequately inflated. There is no focal infiltrate.
There is no pleural effusion. The heart and pulmonary vascularity
are normal. The mediastinum is normal in width. There is faint
calcification in the wall of the aortic arch.
IMPRESSION: There is no acute cardiopulmonary abnormality.

Thoracic aortic atherosclerosis.

## 2019-09-02 IMAGING — CT CT ANGIO CHEST
3 of 14 series · 12 of 37 positions shown · IV contrast (APPLIED)
Comparison: None.

CLINICAL DATA: 56-year-old male with history of severe aortic
stenosis. Preprocedural study prior to potential transcatheter
aortic valve replacement (TAVR) procedure.

EXAM:
CT ANGIOGRAPHY CHEST, ABDOMEN AND PELVIS
TECHNIQUE: Multidetector CT imaging through the chest, abdomen and pelvis was
performed using the standard protocol during bolus administration of
intravenous contrast. Multiplanar reconstructed images and MIPs were
obtained and reviewed to evaluate the vascular anatomy.
CONTRAST:  100 mL of Isovue 370.

[Series 7: best syst 20 % · axial · 0.38mm/px · z∈[+1134,+1187]mm · 2 of 530 slices shown]
[im 133/530  lung]
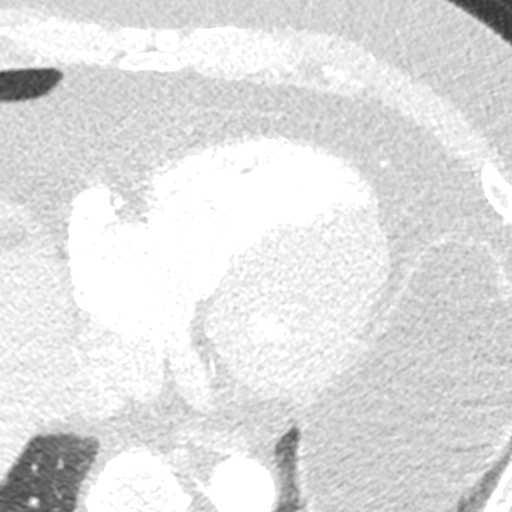
[im 265/530  lung]
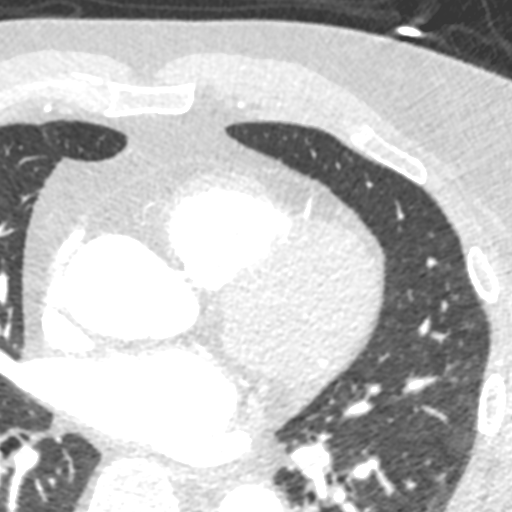

[Series 14: ax thins · axial · 0.87mm/px · z∈[+754,+1224]mm · 5 of 706 slices shown (1 of 2)]
[im 118/706  lung]
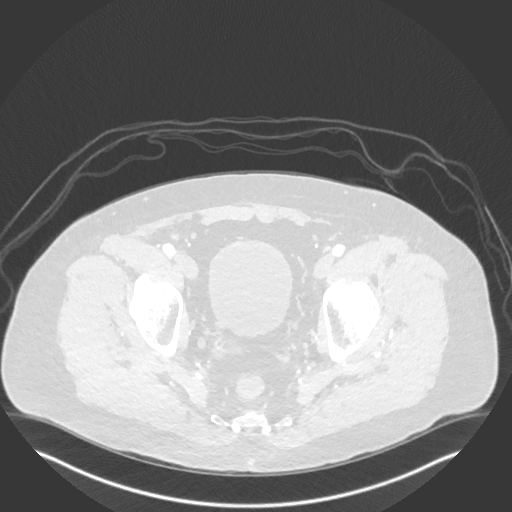
[im 236/706  mediastinal]
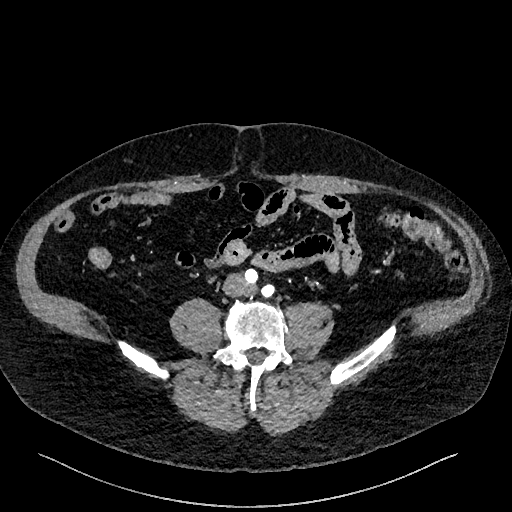
[im 353/706  lung]
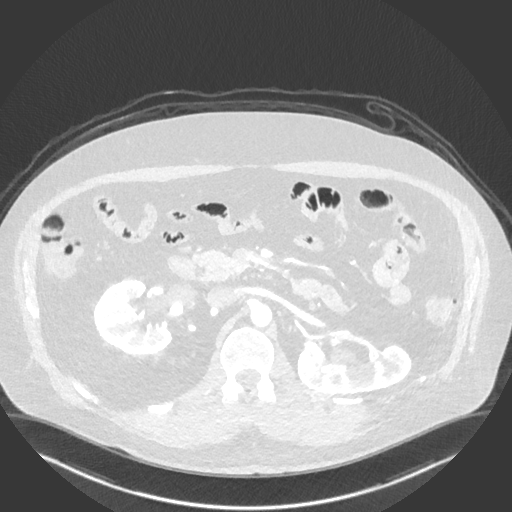
[im 471/706  mediastinal]
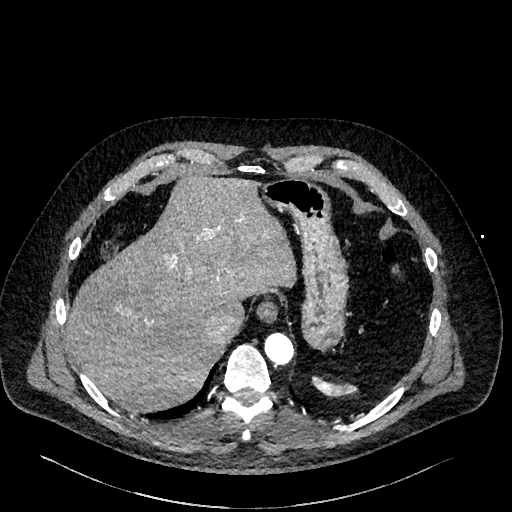
[im 588/706  lung]
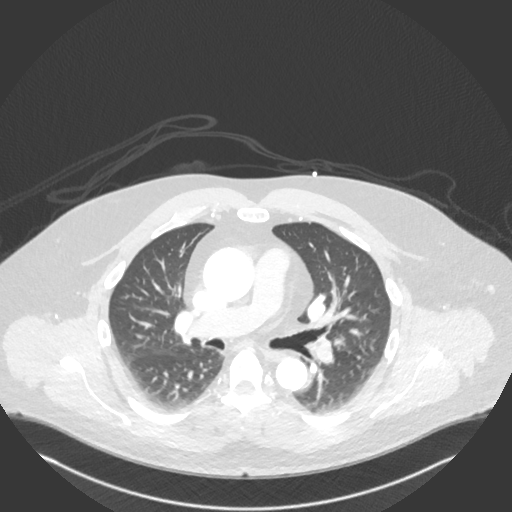

[Series 20: ax thins · axial · 0.63mm/px · z∈[+755,+1224]mm · 5 of 705 slices shown (2 of 2)]
[im 118/705  lung]
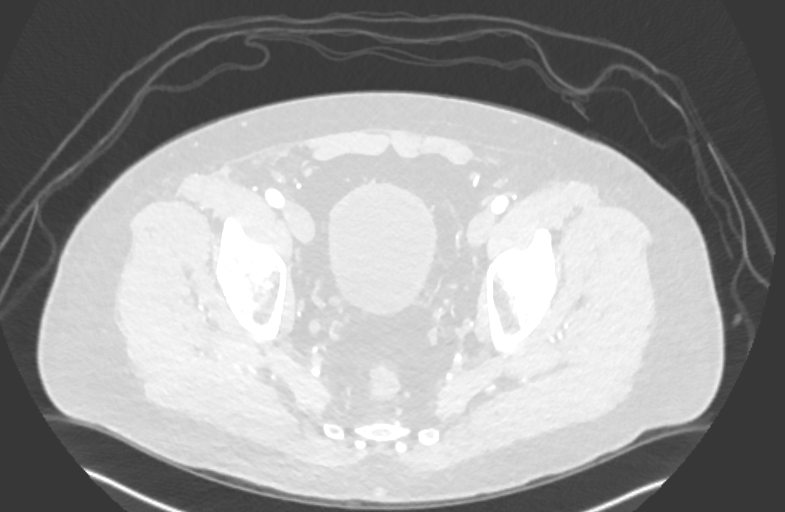
[im 235/705  lung]
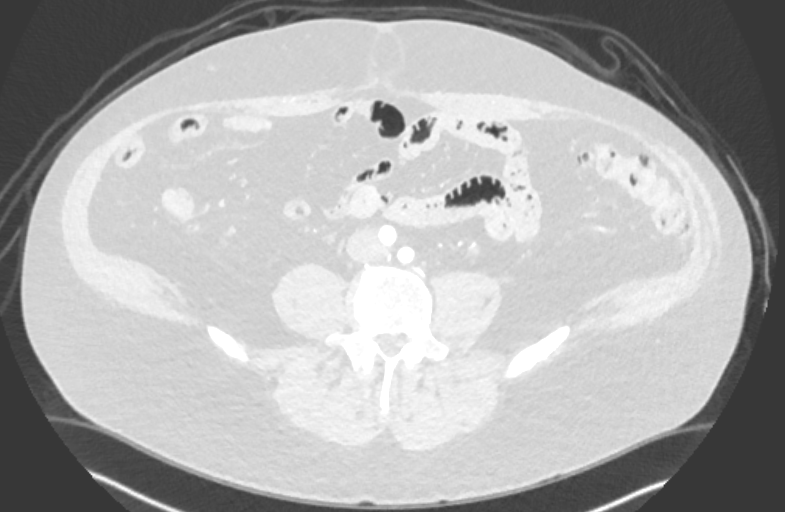
[im 353/705  lung]
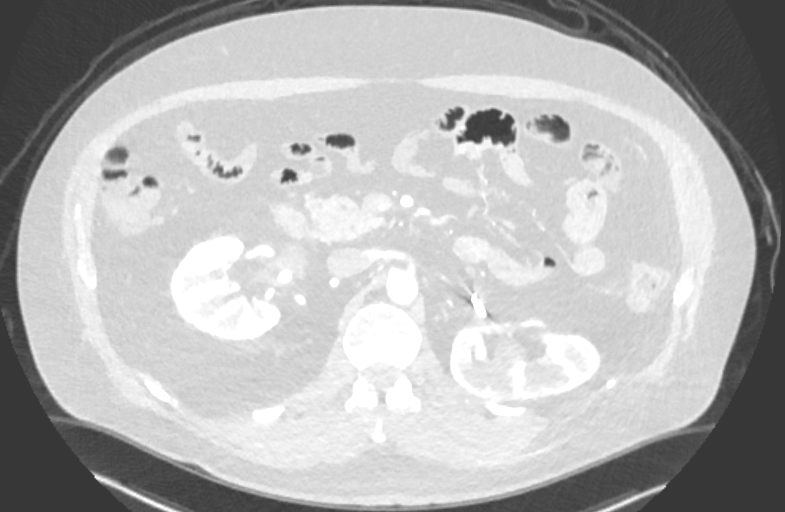
[im 470/705  lung]
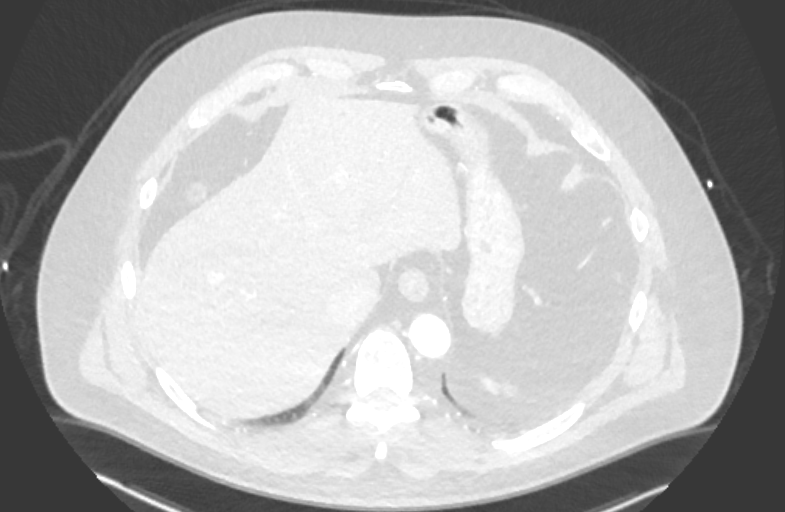
[im 587/705  lung]
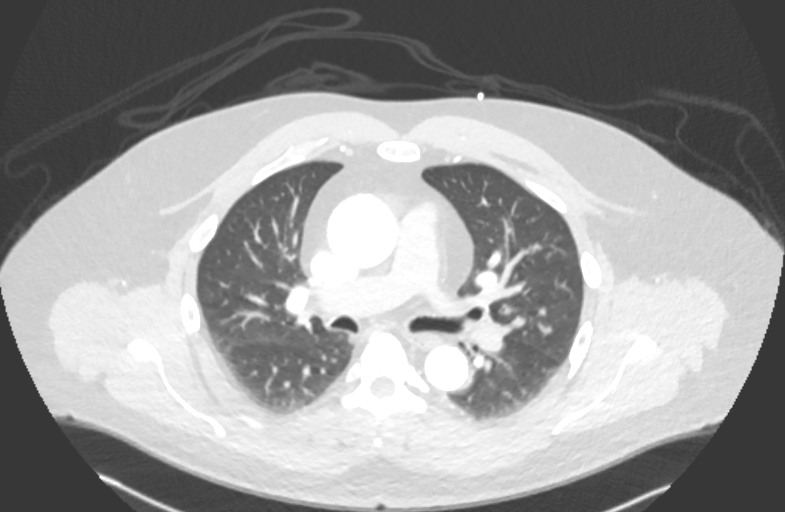

[12 of 37 positions shown; findings below may reference images not displayed]

FINDINGS: CTA CHEST FINDINGS

Cardiovascular: Heart size is normal. However, there does appear to
be concentric left ventricular hypertrophy. There is no significant
pericardial fluid, thickening or pericardial calcification. No
atherosclerotic calcifications identified in the thoracic aorta or
the coronary arteries. Ectasia of the ascending thoracic aorta which
measures up to 4.2 cm in diameter. Severe thickening calcification
of the aortic valve. Extensive calcification extending off the
aortic valve along the mitral-aortic intervalvular fibrosa.

Mediastinum/Lymph Nodes: No pathologically enlarged mediastinal or
hilar lymph nodes. Esophagus is unremarkable in appearance. No
axillary lymphadenopathy.

Lungs/Pleura: No acute consolidative airspace disease. No pleural
effusions. No suspicious appearing pulmonary nodules or masses.

Musculoskeletal/Soft Tissues: There are no aggressive appearing
lytic or blastic lesions noted in the visualized portions of the
skeleton. Old healed fracture of the right clavicle incidentally
noted.

CTA ABDOMEN AND PELVIS FINDINGS

Hepatobiliary: No suspicious cystic or solid hepatic lesions. No
intra or extrahepatic biliary ductal dilatation. Gallbladder is
normal in appearance.

Pancreas: No pancreatic mass. No pancreatic ductal dilatation. No
pancreatic or peripancreatic fluid or inflammatory changes.

Spleen: Unremarkable.

Adrenals/Urinary Tract: Altered left renal axis with displacement
and rotation of the left kidney which sits more posteriorly in the
left renal fossa than typically seen. Surgical clips are noted
adjacent to the left renal hilum. Nonobstructive calculi are noted
within both renal collecting systems, the largest of which measures
11 mm in the right renal pelvis. Profound thickening and enhancement
of the urothelium in the left renal pelvis extending toward the left
UPJ, where the urothelium is estimated to measure up to 1 cm in
thickness (coronal image 64 of series 16), and there is slight
haziness in the adjacent peripelvic fat. No hydroureteronephrosis.
Urinary bladder is unremarkable in appearance. Bilateral adrenal
glands a normal in appearance.

Stomach/Bowel: Normal appearance of the stomach. No pathologic
dilatation of small bowel or colon. Several colonic diverticulae are
noted, without surrounding inflammatory changes to suggest an acute
diverticulitis at this time. Normal appendix.

Vascular/Lymphatic: No significant atherosclerotic disease in the
abdominal or pelvic vasculature. Vascular findings and measurements
pertinent to potential TAVR procedure, as detailed below. No
aneurysm or dissection noted in the abdominal or pelvic vasculature.
Celiac axis, superior mesenteric artery and inferior mesenteric
artery are all widely patent without hemodynamically significant
stenosis. Single renal arteries bilaterally are both widely patent
without hemodynamically significant stenosis. No lymphadenopathy
noted in the abdomen or pelvis.

Reproductive: Prostate gland seminal vesicles are unremarkable in
appearance.

Other: Small umbilical hernia containing only omental fat. No
significant volume of ascites. No pneumoperitoneum.

Musculoskeletal: There are no aggressive appearing lytic or blastic
lesions noted in the visualized portions of the skeleton.

VASCULAR MEASUREMENTS PERTINENT TO TAVR:

AORTA:

Minimal Aortic Diameter -  18 x 16 mm

Severity of Aortic Calcification -  none

RIGHT PELVIS:

Right Common Iliac Artery -

Minimal Diameter - 10.3 x 10.1 mm

Tortuosity - mild

Calcification - none

Right External Iliac Artery -

Minimal Diameter - 6.9 x 6.6 mm

Tortuosity - mild

Calcification - none

Right Common Femoral Artery -

Minimal Diameter - 8.0 x 7.6 mm

Tortuosity - mild

Calcification - none

LEFT PELVIS:

Left Common Iliac Artery -

Minimal Diameter - 8.5 x 9.0 mm

Tortuosity - severe

Calcification - none

Left External Iliac Artery -

Minimal Diameter - 7.6 x 7.8 mm

Tortuosity - severe

Calcification - none

Left Common Femoral Artery -

Minimal Diameter - 8.3 x 8.2 mm

Tortuosity - mild

Calcification - none

Review of the MIP images confirms the above findings.
IMPRESSION: 1. Vascular findings and measurements pertinent to potential TAVR
procedure, as detailed above. The patient has suitable pelvic
arterial access bilaterally, however, right-sided access is likely
optimal given less severe tortuosity on the right side as compared
with the left.
2. Severe thickening calcification of the aortic valve, compatible
with the reported clinical history of severe aortic stenosis.
Notably, the calcifications extend caudally from the aortic valve
along the mitral-aortic intervalvular fibrosa, which may have
implications for upcoming TAVR. Please see description on
contemporaneously obtained cardiac CTA for full details.
3. Ectasia of the ascending thoracic aorta which measures up to
cm in diameter. Recommend annual imaging followup by CTA or MRA.
This recommendation follows 7393
ACCF/AHA/AATS/ACR/ASA/SCA/TIGER/TIGER/TIGER/SATU Guidelines for the
Diagnosis and Management of Patients with Thoracic Aortic Disease.
Circulation. 7393; 121: e266-e369.
4. Nonobstructive calculi in both renal collecting systems. On the
right side, there is extensive urothelial thickening and enhancement
in the right renal pelvis. This may simply be related to chronic
irritation from the indwelling stone in the right renal pelvis,
however, outpatient referral to Urology for further evaluation is
strongly recommended, as the possibility of upper tract urothelial
neoplasm is not excluded.
5. Additional incidental findings, as above.
Aortic Atherosclerosis ([id number]).

## 2019-11-19 ENCOUNTER — Other Ambulatory Visit: Payer: Self-pay | Admitting: Cardiology

## 2019-11-19 DIAGNOSIS — I10 Essential (primary) hypertension: Secondary | ICD-10-CM

## 2020-02-24 DIAGNOSIS — R5381 Other malaise: Secondary | ICD-10-CM

## 2020-02-24 DIAGNOSIS — J3089 Other allergic rhinitis: Secondary | ICD-10-CM

## 2020-02-24 HISTORY — DX: Other malaise: R53.81

## 2020-02-24 HISTORY — DX: Other allergic rhinitis: J30.89

## 2020-03-02 ENCOUNTER — Encounter: Payer: Self-pay | Admitting: Gastroenterology

## 2020-03-16 ENCOUNTER — Encounter: Payer: Self-pay | Admitting: Cardiology

## 2020-03-16 ENCOUNTER — Ambulatory Visit (INDEPENDENT_AMBULATORY_CARE_PROVIDER_SITE_OTHER): Payer: No Typology Code available for payment source | Admitting: Cardiology

## 2020-03-16 ENCOUNTER — Other Ambulatory Visit: Payer: Self-pay

## 2020-03-16 VITALS — BP 120/92 | HR 86 | Ht 73.0 in | Wt 281.8 lb

## 2020-03-16 DIAGNOSIS — I712 Thoracic aortic aneurysm, without rupture, unspecified: Secondary | ICD-10-CM

## 2020-03-16 DIAGNOSIS — E782 Mixed hyperlipidemia: Secondary | ICD-10-CM | POA: Diagnosis not present

## 2020-03-16 DIAGNOSIS — I10 Essential (primary) hypertension: Secondary | ICD-10-CM

## 2020-03-16 DIAGNOSIS — Z953 Presence of xenogenic heart valve: Secondary | ICD-10-CM

## 2020-03-16 MED ORDER — LOSARTAN POTASSIUM 50 MG PO TABS
50.0000 mg | ORAL_TABLET | Freq: Every day | ORAL | 0 refills | Status: DC
Start: 2020-03-16 — End: 2020-04-21

## 2020-03-16 MED ORDER — AMOXICILLIN 500 MG PO TABS: 500.0000 mg | ORAL_TABLET | ORAL | 3 refills | Status: DC | PRN

## 2020-03-16 NOTE — Progress Notes (Signed)
Cardiology Office Note:    Date:  03/16/2020   ID:  [name], [name] 11-Mar-1961, MRN [id number]  PCP:  [name], MD  Cardiologist:  Norman Herrlich, MD    Referring MD: [name], MD    ASSESSMENT:    1. S/P aortic valve replacement with bioprosthetic valve   2. Thoracic aortic aneurysm without rupture (HCC)   3. Essential hypertension   4. Mixed hyperlipidemia    PLAN:    In order of problems listed above:  1. He continues to do well after valve replacement no evidence of dysfunction follows endocarditis prophylaxis and will need a repeat echocardiogram year 5 after surgery in 2023 continue endocarditis prophylaxis 2. BP at target continue his current antihypertensive 3. Sent lipids are stable cholesterol 165 LDL 122 triglyceride 80 HDL 40 not on statin   Next appointment: 1 year   Medication Adjustments/Labs and Tests Ordered: Current medicines are reviewed at length with the patient today.  Concerns regarding medicines are outlined above.  No orders of the defined types were placed in this encounter.  No orders of the defined types were placed in this encounter.   Chief Complaint  Patient presents with  . Follow-up    He had aortic valve replacement bioprosthetic and repair of ascending aorta with graft 08/23/2017.    History of Present Illness:    [name] is a 59 y.o. male with a hx of symptomatic aortic stenosis who underwent intervention at [facility] 08/23/2017 with a bioprosthetic AVR and ascending aortic graft. He was last seen 07/08/2019. Testing performed echocardiogram in January 2019 which showed normal bioprosthetic aortic valve function normal left ventricular ejection fraction and he wore a event monitor day which showed no significant arrhythmia. Also has psoriasis and arthritis. He also has obstructive sleep apnea treated with an compliant with CPAP.  Compliance with diet, lifestyle and medications: Yes  He is doing much  better he has no cardiovascular symptoms of dyspnea chest pain palpitation he has gained weight his psoriasis and arthritis are improved.  Follows endocarditis we will continue the same I reviewed his recent labs with him that are reassuring and strongly encouraged him to get COVID-19 immunization  Recent labs [facility] shows a normal TSH 2.07 T4 7.9 and free thyroxine index of 8.8. CBC was normal hemoglobin is 16 BMP showed a potassium of 4.2 and a creatinine at 1.21 normal liver function test. Iron studies were normal Past Medical History:  Diagnosis Date  . Anemia 04/03/2017  . Asthma 04/03/2017  . Bicuspid aortic valve 04/06/2016  . Diverticulosis 04/03/2017  . 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 Essential hypertension 04/06/2016  . Hyperlipidemia 04/06/2016  . Mild aortic stenosis 04/06/2016   Overview:  Mild at last FU 2015  . Obstructive sleep apnea 04/03/2017  . Psoriasis 04/03/2017    Past Surgical History:  Procedure Laterality Date  . HERNIA REPAIR    . LITHOTRIPSY    . RIGHT/LEFT HEART CATH AND CORONARY ANGIOGRAPHY N/A 04/24/2017   Procedure: RIGHT/LEFT HEART CATH AND CORONARY ANGIOGRAPHY;  Surgeon: Lyn Records, MD;  Location: [facility] INVAS[facility];  Service: Cardiovascular;  Laterality: N/A;    Current Medications: Current Meds  Medication Sig  . albuterol (PROVENTIL HFA;VENTOLIN HFA) 108 (90 Base) MCG/ACT inhaler Inhale 2 puffs into the lungs every 4 (four) hours as needed for wheezing.  Marland Kitchen amoxicillin (AMOXIL) 500 MG tablet Take 1 tablet (500  mg total) by mouth as needed. Take 4 capsules 1 hour prior to dental appointment  . aspirin EC 81 MG tablet Take 81 mg by mouth daily.  . Cholecalciferol (VITAMIN D) 2000 units CAPS Take 2,000 Units by mouth daily.   . Coenzyme Q10 (CO Q 10) 100 MG CAPS Take 1 capsule by mouth daily.  . fluticasone (FLONASE) 50 MCG/ACT nasal spray Place 1 spray into both nostrils daily.  Marland Kitchen LORazepam (ATIVAN) 1 MG  tablet Take 1 tablet (1 mg total) by mouth 2 (two) times daily as needed for anxiety.  Marland Kitchen losartan (COZAAR) 100 MG tablet TAKE ONE TABLET BY MOUTH ONCE DAILY. REDUCE TO 1/2 TABLET FOR SYSTOLIC OF <105.  . montelukast (SINGULAIR) 10 MG tablet Take 10 mg by mouth daily as needed (allergies).   . nitroGLYCERIN (NITROSTAT) 0.4 MG SL tablet Place 1 tablet (0.4 mg total) under the tongue every 5 (five) minutes as needed for chest pain.  . Risankizumab-rzaa,150 MG Dose, (SKYRIZI, 150 MG DOSE,) 75 MG/0.83ML PSKT   . sildenafil (VIAGRA) 50 MG tablet Take by mouth.  . Vitamins/Minerals TABS Take 1 tablet by mouth.     Allergies:   Sulfamethoxazole   Social History   Socioeconomic History  . Marital status: Married    Spouse name: Not on file  . Number of children: Not on file  . Years of education: Not on file  . Highest education level: Not on file  Occupational History  . Not on file  Tobacco Use  . Smoking status: Never Smoker  . Smokeless tobacco: Never Used  Vaping Use  . Vaping Use: Never used  Substance and Sexual Activity  . Alcohol use: No  . Drug use: No  . Sexual activity: Not on file  Other Topics Concern  . Not on file  Social History Narrative  . Not on file   Social Determinants of Health   Financial Resource Strain:   . Difficulty of Paying Living Expenses:   Food Insecurity:   . Worried About Programme researcher, broadcasting/film/video in the Last Year:   . Barista in the Last Year:   Transportation Needs:   . Freight forwarder (Medical):   Marland Kitchen Lack of Transportation (Non-Medical):   Physical Activity:   . Days of Exercise per Week:   . Minutes of Exercise per Session:   Stress:   . Feeling of Stress :   Social Connections:   . Frequency of Communication with Friends and Family:   . Frequency of Social Gatherings with Friends and Family:   . Attends Religious Services:   . Active Member of Clubs or Organizations:   . Attends Banker Meetings:   Marland Kitchen Marital  Status:      Family History: The patient's family history includes Cancer in his sister; Diabetes in his father. ROS:   Please see the history of present illness.    All other systems reviewed and are negative.  EKGs/Labs/Other Studies Reviewed:      Recent Labs: 07/08/2019: ALT 37; BUN 19; Creatinine, Ser 1.20; Hemoglobin 16.1; Platelets 200; Potassium 4.2; Sodium 140; TSH 1.870  Recent Lipid Panel    Component Value Date/Time   CHOL 133 07/08/2019 0901   TRIG 88 07/08/2019 0901   HDL 43 07/08/2019 0901   CHOLHDL 3.1 07/08/2019 0901   LDLCALC 73 07/08/2019 0901    Physical Exam:    VS:  BP (!) 120/92   Pulse 86   Ht 6\' 1"  (  1.854 m)   Wt (!) 281 lb 12.8 oz (127.8 kg)   SpO2 92%   BMI 37.18 kg/m     Wt Readings from Last 3 Encounters:  03/16/20 (!) 281 lb 12.8 oz (127.8 kg)  07/08/19 285 lb 12.8 oz (129.6 kg)  07/10/18 285 lb 3.2 oz (129.4 kg)  P blood pressure 1 3280  GEN:  Well nourished, well developed in no acute distress HEENT: Normal NECK: No JVD; No carotid bruits LYMPHATICS: No lymphadenopathy CARDIAC: RRR, no murmurs, rubs, gallops RESPIRATORY:  Clear to auscultation without rales, wheezing or rhonchi  ABDOMEN: Soft, non-tender, non-distended MUSCULOSKELETAL:  No edema; No deformity  SKIN: Warm and dry NEUROLOGIC:  Alert and oriented x 3 PSYCHIATRIC:  Normal affect    Signed, Norman Herrlich, MD  03/16/2020 4:43 PM    [facility]

## 2020-03-16 NOTE — Patient Instructions (Signed)

## 2020-04-12 ENCOUNTER — Ambulatory Visit (AMBULATORY_SURGERY_CENTER): Payer: Self-pay | Admitting: *Deleted

## 2020-04-12 ENCOUNTER — Other Ambulatory Visit: Payer: Self-pay

## 2020-04-12 VITALS — Ht 72.0 in | Wt 274.0 lb

## 2020-04-12 DIAGNOSIS — Z01818 Encounter for other preprocedural examination: Secondary | ICD-10-CM

## 2020-04-12 DIAGNOSIS — Z1211 Encounter for screening for malignant neoplasm of colon: Secondary | ICD-10-CM

## 2020-04-12 MED ORDER — SUTAB 1479-225-188 MG PO TABS: 1.0000 | ORAL_TABLET | ORAL | 0 refills | Status: DC

## 2020-04-12 NOTE — Progress Notes (Signed)
Patient and wife is here in-person for PV. Patient denies any allergies to eggs or soy. Patient denies any problems with anesthesia/sedation. Patient denies any oxygen use at home. Patient denies taking any diet/weight loss medications or blood thinners. Patient is not being treated for MRSA or C-diff. Patient is aware of our care-partner policy and Covid-19 safety protocol. COVID-19 screening test is on 8/30, the pt is aware.   Prep Prescription coupon given to the patient.

## 2020-04-13 ENCOUNTER — Encounter: Payer: Self-pay | Admitting: Gastroenterology

## 2020-04-13 DIAGNOSIS — N200 Calculus of kidney: Secondary | ICD-10-CM

## 2020-04-13 HISTORY — DX: Calculus of kidney: N20.0

## 2020-04-21 ENCOUNTER — Other Ambulatory Visit: Payer: Self-pay | Admitting: Cardiology

## 2020-04-26 ENCOUNTER — Encounter: Payer: BC Managed Care – PPO | Admitting: Gastroenterology

## 2021-01-30 ENCOUNTER — Telehealth: Payer: Self-pay | Admitting: *Deleted

## 2021-01-30 MED ORDER — LOSARTAN POTASSIUM 50 MG PO TABS: 1.0000 | ORAL_TABLET | Freq: Every day | ORAL | 0 refills | Status: DC

## 2021-01-30 NOTE — Telephone Encounter (Signed)
Refills sent

## 2021-02-13 DIAGNOSIS — H6991 Unspecified Eustachian tube disorder, right ear: Secondary | ICD-10-CM

## 2021-02-13 HISTORY — DX: Unspecified eustachian tube disorder, right ear: H69.91

## 2021-03-16 ENCOUNTER — Ambulatory Visit (INDEPENDENT_AMBULATORY_CARE_PROVIDER_SITE_OTHER): Payer: No Typology Code available for payment source | Admitting: Cardiology

## 2021-03-16 ENCOUNTER — Other Ambulatory Visit: Payer: Self-pay

## 2021-03-16 ENCOUNTER — Encounter: Payer: Self-pay | Admitting: Cardiology

## 2021-03-16 VITALS — BP 140/80 | HR 100 | Ht 72.0 in | Wt 283.8 lb

## 2021-03-16 DIAGNOSIS — I712 Thoracic aortic aneurysm, without rupture, unspecified: Secondary | ICD-10-CM

## 2021-03-16 DIAGNOSIS — I10 Essential (primary) hypertension: Secondary | ICD-10-CM | POA: Diagnosis not present

## 2021-03-16 DIAGNOSIS — Z953 Presence of xenogenic heart valve: Secondary | ICD-10-CM | POA: Diagnosis not present

## 2021-03-16 DIAGNOSIS — E782 Mixed hyperlipidemia: Secondary | ICD-10-CM | POA: Diagnosis not present

## 2021-03-16 NOTE — Patient Instructions (Signed)
Medication Instructions:  Your physician recommends that you continue on your current medications as directed. Please refer to the Current Medication list given to you today.  *If you need a refill on your cardiac medications before your next appointment, please call your pharmacy*   Lab Work: None If you have labs (blood work) drawn today and your tests are completely normal, you will receive your results only by: MyChart Message (if you have MyChart) OR A paper copy in the mail If you have any lab test that is abnormal or we need to change your treatment, we will call you to review the results.   Testing/Procedures: Your physician has requested that you have an echocardiogram in one year. Echocardiography is a painless test that uses sound waves to create images of your heart. It provides your doctor with information about the size and shape of your heart and how well your heart's chambers and valves are working. This procedure takes approximately one hour. There are no restrictions for this procedure.    Follow-Up: At [facility], you and your health needs are our priority.  As part of our continuing mission to provide you with exceptional heart care, we have created designated Provider Care Teams.  These Care Teams include your primary Cardiologist (physician) and Advanced Practice Providers (APPs -  Physician Assistants and Nurse Practitioners) who all work together to provide you with the care you need, when you need it.  We recommend signing up for the patient portal called "MyChart".  Sign up information is provided on this After Visit Summary.  MyChart is used to connect with patients for Virtual Visits (Telemedicine).  Patients are able to view lab/test results, encounter notes, upcoming appointments, etc.  Non-urgent messages can be sent to your provider as well.   To learn more about what you can do with MyChart, go to [email or web address].    Your next appointment:   1  year(s)  The format for your next appointment:   In Person  Provider:   Norman Herrlich, MD   Other Instructions

## 2021-03-16 NOTE — Progress Notes (Signed)
Cardiology Office Note:    Date:  03/16/2021   ID:  [name] Jun 24, 1961, MRN [id number]  PCP:  Myrlene Broker, MD  Cardiologist:  Shirlee More, MD    Referring MD: Myrlene Broker, MD    ASSESSMENT:    1. S/P aortic valve replacement with bioprosthetic valve   2. Thoracic aortic aneurysm without rupture ([place])   3. Essential hypertension   4. Mixed hyperlipidemia    PLAN:    In order of problems listed above:  Stable following surgical AVR we will plan echocardiogram next summer 5 years and see me soon afterwards no evidence of valve dysfunction and will continue amoxicillin antibiotic prophylaxis for dental Stable following surgical intervention I am unsure of his blood pressure control is not checking it and I told him before any discussion of withdrawing antihypertensive agents I have to know he is consistently less than 130/80 I asked him to start checking on a regular basis No longer on lipid-lowering treatment   Next appointment: 1 year   Medication Adjustments/Labs and Tests Ordered: Current medicines are reviewed at length with the patient today.  Concerns regarding medicines are outlined above.  Orders Placed This Encounter  Procedures   EKG 12-Lead   ECHOCARDIOGRAM COMPLETE   No orders of the defined types were placed in this encounter.   Chief Complaint  Patient presents with   Follow-up    With surgical AVR and thoracic aortic graft for aneurysm [facility] 08/13/2017    History of Present Illness:    [name] is a 60 y.o. male with a hx of symptomatic aortic stenosis with surgical AVR bioprosthetic and ascending aortic graft [facility] 08/13/2017.  He was last seen 03/16/2020.Echocardiogram in January 2019 which showed normal bioprosthetic aortic valve function normal left ventricular ejection fraction and he wore a event monitor day which showed no significant arrhythmia   Compliance with diet, lifestyle and medications: Yes  He has  done well since aortic valve replacement has no exertional chest pain dyspnea palpitations syncope.  Since surgery he has had some localized positional sternal pain unchanged. He is unvaccinated had COVID earlier this year His goal is to get off blood pressure medications repeat by me in the office large cuff 140/80  Most recent labs PCP 04/13/2020 creatinine 1.28 sodium 137 potassium 4.6 16.8 hemoglobin  Past Medical History:  Diagnosis Date   Anemia 04/03/2017   Asthma 04/03/2017   Bicuspid aortic valve 04/06/2016   Diverticulosis 04/03/2017   Enlarged thoracic aorta ([place]) 04/06/2016   Overview:  39 mm aortic root   Essential hypertension 04/06/2016   Hyperlipidemia 04/06/2016   Mild aortic stenosis 04/06/2016   Overview:  Mild at last FU 2015   Obstructive sleep apnea 04/03/2017   Psoriasis 04/03/2017   Sleep apnea    uses CPAP     Past Surgical History:  Procedure Laterality Date   AORTIC VALVE SURGERY  2018   COLONOSCOPY  2008   normal exam   HERNIA REPAIR     LITHOTRIPSY     RIGHT/LEFT HEART CATH AND CORONARY ANGIOGRAPHY N/A 04/24/2017   Procedure: RIGHT/LEFT HEART CATH AND CORONARY ANGIOGRAPHY;  Surgeon: Belva Crome, MD;  Location: [place] CV LAB;  Service: Cardiovascular;  Laterality: N/A;   THUMB FUSION  2005   UPPER GASTROINTESTINAL ENDOSCOPY  2018    Current Medications: Current Meds  Medication Sig   amoxicillin (AMOXIL) 500 MG tablet Take 1 tablet (500 mg total) by mouth as needed.  Take 4 capsules 1 hour prior to dental appointment   Ascorbic Acid (VITAMIN C PO) Take by mouth.   aspirin EC 81 MG tablet Take 81 mg by mouth daily.   Cholecalciferol (VITAMIN D) 2000 units CAPS Take 2,000 Units by mouth daily.    Coenzyme Q10 (CO Q 10) 100 MG CAPS Take 1 capsule by mouth daily.   fluticasone (FLONASE) 50 MCG/ACT nasal spray Place 1 spray into both nostrils daily.   losartan (COZAAR) 50 MG tablet Take 1 tablet (50 mg total) by mouth daily. KEEP OV.   montelukast  (SINGULAIR) 10 MG tablet Take 10 mg by mouth daily as needed (allergies).    nitroGLYCERIN (NITROSTAT) 0.4 MG SL tablet Place 1 tablet (0.4 mg total) under the tongue every 5 (five) minutes as needed for chest pain.   Risankizumab-rzaa,150 MG Dose, (SKYRIZI, 150 MG DOSE,) 75 MG/0.83ML PSKT    sildenafil (VIAGRA) 50 MG tablet Take by mouth.   Sodium Sulfate-Mag Sulfate-KCl (SUTAB) [phone] MG TABS Take 1 kit by mouth as directed. MANUFACTURER CODES!! BIN: [id number] PCN: CN GROUP: [id number] MEMBER ID: [id number];RJJ AS CASH;NO PRIOR AUTHORIZATION   zinc gluconate 50 MG tablet Take 50 mg by mouth daily.     Allergies:   Sulfamethoxazole   Social History   Socioeconomic History   Marital status: Married    Spouse name: Not on file   Number of children: Not on file   Years of education: Not on file   Highest education level: Not on file  Occupational History   Not on file  Tobacco Use   Smoking status: Never   Smokeless tobacco: Never  Vaping Use   Vaping Use: Never used  Substance and Sexual Activity   Alcohol use: No   Drug use: No   Sexual activity: Not on file  Other Topics Concern   Not on file  Social History Narrative   Not on file   Social Determinants of Health   Financial Resource Strain: Not on file  Food Insecurity: Not on file  Transportation Needs: Not on file  Physical Activity: Not on file  Stress: Not on file  Social Connections: Not on file     Family History: The patient's family history includes Cancer in his sister; Diabetes in his father; Diverticulitis in his brother. There is no history of Colon cancer, Colon polyps, Esophageal cancer, Rectal cancer, or Stomach cancer. ROS:   Please see the history of present illness.    All other systems reviewed and are negative.  EKGs/Labs/Other Studies Reviewed:    The following studies were reviewed today:  EKG:  EKG ordered today and personally reviewed.  The ekg ordered today demonstrates sinus rhythm  normal EKG  Recent Labs: No results found for requested labs within last 8760 hours.  Recent Lipid Panel    Component Value Date/Time   CHOL 133 07/08/2019 0901   TRIG 88 07/08/2019 0901   HDL 43 07/08/2019 0901   CHOLHDL 3.1 07/08/2019 0901   LDLCALC 73 07/08/2019 0901    Physical Exam:    VS:  BP (!) 144/90 (BP Location: Right Arm, Patient Position: Sitting, Cuff Size: Large)   Pulse 100   Ht 6' (1.829 m)   Wt 283 lb 12.8 oz (128.7 kg)   SpO2 97%   BMI 38.49 kg/m     Wt Readings from Last 3 Encounters:  03/16/21 283 lb 12.8 oz (128.7 kg)  04/12/20 274 lb (124.3 kg)  03/16/20 (!) 281 lb 12.8 oz (  127.8 kg)     GEN:  Well nourished, well developed in no acute distress HEENT: Normal NECK: No JVD; No carotid bruits LYMPHATICS: No lymphadenopathy CARDIAC: He has sternal keloid RRR, no murmurs, rubs, gallops RESPIRATORY:  Clear to auscultation without rales, wheezing or rhonchi  ABDOMEN: Soft, non-tender, non-distended MUSCULOSKELETAL:  No edema; No deformity  SKIN: Warm and dry NEUROLOGIC:  Alert and oriented x 3 PSYCHIATRIC:  Normal affect    Signed, Shirlee More, MD  03/16/2021 4:18 PM    [facility]

## 2021-04-27 ENCOUNTER — Other Ambulatory Visit: Payer: Self-pay | Admitting: Cardiology

## 2021-04-27 NOTE — Telephone Encounter (Signed)
Losartan 50 mg # 90 x 3 refill sent to [facility] [place], [place] - [address] AT Portal to Registered [employer] Sites

## 2021-09-25 DIAGNOSIS — H6122 Impacted cerumen, left ear: Secondary | ICD-10-CM | POA: Insufficient documentation

## 2021-09-25 DIAGNOSIS — J343 Hypertrophy of nasal turbinates: Secondary | ICD-10-CM

## 2021-09-25 DIAGNOSIS — J342 Deviated nasal septum: Secondary | ICD-10-CM

## 2021-09-25 HISTORY — DX: Deviated nasal septum: J34.2

## 2021-09-25 HISTORY — DX: Hypertrophy of nasal turbinates: J34.3

## 2021-09-25 HISTORY — DX: Impacted cerumen, left ear: H61.22

## 2022-03-05 ENCOUNTER — Ambulatory Visit (INDEPENDENT_AMBULATORY_CARE_PROVIDER_SITE_OTHER): Payer: No Typology Code available for payment source

## 2022-03-05 DIAGNOSIS — Z953 Presence of xenogenic heart valve: Secondary | ICD-10-CM

## 2022-03-05 LAB — ECHOCARDIOGRAM COMPLETE
AR max vel: 1.72 cm2
AV Area VTI: 1.82 cm2
AV Area mean vel: 1.8 cm2
AV Mean grad: 11 mmHg
AV Peak grad: 19 mmHg
Ao pk vel: 2.18 m/s
Area-P 1/2: 4.41 cm2
S' Lateral: 2.9 cm

## 2022-03-11 NOTE — Progress Notes (Unsigned)
Cardiology Office Note:    Date:  03/11/2022   ID:  [name], [name] 03-13-1961, MRN [id number]  PCP:  [name], MD  Cardiologist:  Norman Herrlich, MD    Referring MD: [name], MD    ASSESSMENT:    No diagnosis found. PLAN:    In order of problems listed above:  ***   Next appointment: ***   Medication Adjustments/Labs and Tests Ordered: Current medicines are reviewed at length with the patient today.  Concerns regarding medicines are outlined above.  No orders of the defined types were placed in this encounter.  No orders of the defined types were placed in this encounter.   No chief complaint on file.   History of Present Illness:    [name] is a 61 y.o. male with a hx of symptomatic aortic stenosis with surgical AVR bioprosthetic and ascending aortic graft [facility] 08/13/2017  last seen 07/22/203. Compliance with diet, lifestyle and medications: ***  He had an echocardiogram performed 03/05/2022: His AVR function was normal with no evidence of valve dysfunction moderate paravalvular regurgitation left ventricle abnormal ejection fraction 60 to 65% with severe LVH right ventricle normal size and function.  0  1. Left ventricular ejection fraction, by estimation, is 60 to 65%. The  left ventricle has normal function. The left ventricle has no regional  wall motion abnormalities. There is severe concentric left ventricular  hypertrophy. Left ventricular diastolic   parameters were normal.   2. Right ventricular systolic function is normal. The right ventricular  size is normal. There is normal pulmonary artery systolic pressure.   3. The mitral valve is normal in structure. No evidence of mitral valve  regurgitation. No evidence of mitral stenosis.   4. Aortic valve regurgitation is not visualized. No aortic stenosis is  present. There is a valve present in the aortic position. Procedure Date:  08/13/2017 SAVR at [facility] REPLACEMENT,  AORTIC VALVE, OPEN, WITH  CARDIOPULMONARY BYPASS, STERNOTOMY; WITH  PROSTHETIC VALVE OTHER THAN HOMOGRAFT OR STENTLESS VALVE. Echo findings  are consistent with normal structure and function of the aortic valve  prosthesis.   5. Aortic Normal DTA.   6. The inferior vena cava is normal in size with greater than 50%  respiratory variability, suggesting right atrial pressure of 3 mmHg.  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   Overview:  39 mm aortic root   Essential hypertension 04/06/2016   Hyperlipidemia 04/06/2016   Mild aortic stenosis 04/06/2016   Overview:  Mild at last FU 2015   Obstructive sleep apnea 04/03/2017   Psoriasis 04/03/2017   Sleep apnea    uses CPAP     Past Surgical History:  Procedure Laterality Date   AORTIC VALVE SURGERY  2018   COLONOSCOPY  2008   normal exam   HERNIA REPAIR     LITHOTRIPSY     RIGHT/LEFT HEART CATH AND CORONARY ANGIOGRAPHY N/A 04/24/2017   Procedure: RIGHT/LEFT HEART CATH AND CORONARY ANGIOGRAPHY;  Surgeon: Lyn Records, MD;  Location: [facility];  Service: Cardiovascular;  Laterality: N/A;   THUMB FUSION  2005   UPPER GASTROINTESTINAL ENDOSCOPY  2018    Current Medications: No outpatient medications have been marked as taking for the 03/12/22 encounter (Appointment) with Baldo Daub, MD.     Allergies:   Sulfamethoxazole  Social History   Socioeconomic History   Marital status: Married    Spouse name: Not on file   Number of children: Not on file   Years of education: Not on file   Highest education level: Not on file  Occupational History   Not on file  Tobacco Use   Smoking status: Never   Smokeless tobacco: Never  Vaping Use   Vaping Use: Never used  Substance and Sexual Activity   Alcohol use: No   Drug use: No   Sexual activity: Not on file  Other Topics Concern    Not on file  Social History Narrative   Not on file   Social Determinants of Health   Financial Resource Strain: Not on file  Food Insecurity: Not on file  Transportation Needs: Not on file  Physical Activity: Not on file  Stress: Not on file  Social Connections: Not on file     Family History: The patient's ***family history includes Cancer in his sister; Diabetes in his father; Diverticulitis in his brother. There is no history of Colon cancer, Colon polyps, Esophageal cancer, Rectal cancer, or Stomach cancer. ROS:   Please see the history of present illness.    All other systems reviewed and are negative.  EKGs/Labs/Other Studies Reviewed:    The following studies were reviewed today:  EKG:  EKG ordered today and personally reviewed.  The ekg ordered today demonstrates ***  Recent Labs: No results found for requested labs within last 365 days.  Recent Lipid Panel    Component Value Date/Time   CHOL 133 07/08/2019 0901   TRIG 88 07/08/2019 0901   HDL 43 07/08/2019 0901   CHOLHDL 3.1 07/08/2019 0901   LDLCALC 73 07/08/2019 0901    Physical Exam:    VS:  There were no vitals taken for this visit.    Wt Readings from Last 3 Encounters:  03/16/21 283 lb 12.8 oz (128.7 kg)  04/12/20 274 lb (124.3 kg)  03/16/20 (!) 281 lb 12.8 oz (127.8 kg)     GEN: *** Well nourished, well developed in no acute distress HEENT: Normal NECK: No JVD; No carotid bruits LYMPHATICS: No lymphadenopathy CARDIAC: ***RRR, no murmurs, rubs, gallops RESPIRATORY:  Clear to auscultation without rales, wheezing or rhonchi  ABDOMEN: Soft, non-tender, non-distended MUSCULOSKELETAL:  No edema; No deformity  SKIN: Warm and dry NEUROLOGIC:  Alert and oriented x 3 PSYCHIATRIC:  Normal affect    Signed, Norman Herrlich, MD  03/11/2022 11:30 AM    [facility]

## 2022-03-12 ENCOUNTER — Ambulatory Visit (INDEPENDENT_AMBULATORY_CARE_PROVIDER_SITE_OTHER): Payer: No Typology Code available for payment source | Admitting: Cardiology

## 2022-03-12 ENCOUNTER — Encounter: Payer: Self-pay | Admitting: Cardiology

## 2022-03-12 VITALS — BP 120/90 | HR 80 | Ht 72.0 in | Wt 285.0 lb

## 2022-03-12 DIAGNOSIS — I10 Essential (primary) hypertension: Secondary | ICD-10-CM

## 2022-03-12 DIAGNOSIS — Z953 Presence of xenogenic heart valve: Secondary | ICD-10-CM | POA: Diagnosis not present

## 2022-03-12 DIAGNOSIS — E782 Mixed hyperlipidemia: Secondary | ICD-10-CM | POA: Diagnosis not present

## 2022-03-12 NOTE — Patient Instructions (Signed)
Medication Instructions:  Your physician recommends that you continue on your current medications as directed. Please refer to the Current Medication list given to you today.  *If you need a refill on your cardiac medications before your next appointment, please call your pharmacy*   Lab Work: Your physician recommends that you return for lab work in:   Labs today: CMP, Lipids, LPa  If you have labs (blood work) drawn today and your tests are completely normal, you will receive your results only by: MyChart Message (if you have MyChart) OR A paper copy in the mail If you have any lab test that is abnormal or we need to change your treatment, we will call you to review the results.   Testing/Procedures: None   Follow-Up: At [facility], you and your health needs are our priority.  As part of our continuing mission to provide you with exceptional heart care, we have created designated Provider Care Teams.  These Care Teams include your primary Cardiologist (physician) and Advanced Practice Providers (APPs -  Physician Assistants and Nurse Practitioners) who all work together to provide you with the care you need, when you need it.  We recommend signing up for the patient portal called "MyChart".  Sign up information is provided on this After Visit Summary.  MyChart is used to connect with patients for Virtual Visits (Telemedicine).  Patients are able to view lab/test results, encounter notes, upcoming appointments, etc.  Non-urgent messages can be sent to your provider as well.   To learn more about what you can do with MyChart, go to [email or web address].    Your next appointment:   1 year(s)  The format for your next appointment:   In Person  Provider:   Norman Herrlich, MD    Other Instructions None  Important Information About Sugar

## 2022-03-13 LAB — LIPOPROTEIN A (LPA): Lipoprotein (a): 77.8 nmol/L — ABNORMAL HIGH (ref <75.0)

## 2022-03-13 LAB — COMPREHENSIVE METABOLIC PANEL
ALT: 31 IU/L (ref 0–44)
AST: 19 IU/L (ref 0–40)
Albumin/Globulin Ratio: 2 (ref 1.2–2.2)
Albumin: 4.5 g/dL (ref 3.8–4.9)
Alkaline Phosphatase: 51 IU/L (ref 44–121)
BUN/Creatinine Ratio: 14 (ref 9–20)
BUN: 18 mg/dL (ref 6–24)
Bilirubin Total: 0.5 mg/dL (ref 0.0–1.2)
CO2: 21 mmol/L (ref 20–29)
Calcium: 10.1 mg/dL (ref 8.7–10.2)
Chloride: 103 mmol/L (ref 96–106)
Creatinine, Ser: 1.28 mg/dL — ABNORMAL HIGH (ref 0.76–1.27)
Globulin, Total: 2.3 g/dL (ref 1.5–4.5)
Glucose: 89 mg/dL (ref 70–99)
Potassium: 4.2 mmol/L (ref 3.5–5.2)
Sodium: 139 mmol/L (ref 134–144)
Total Protein: 6.8 g/dL (ref 6.0–8.5)
eGFR: 64 mL/min/{1.73_m2} (ref >59)

## 2022-03-13 LAB — LIPID PANEL
Chol/HDL Ratio: 4.5 ratio (ref 0.0–5.0)
Cholesterol, Total: 168 mg/dL (ref 100–199)
HDL: 37 mg/dL — ABNORMAL LOW (ref >39)
LDL Chol Calc (NIH): 106 mg/dL — ABNORMAL HIGH (ref 0–99)
Triglycerides: 138 mg/dL (ref 0–149)
VLDL Cholesterol Cal: 25 mg/dL (ref 5–40)

## 2022-03-14 ENCOUNTER — Other Ambulatory Visit: Payer: Self-pay

## 2022-03-14 DIAGNOSIS — E782 Mixed hyperlipidemia: Secondary | ICD-10-CM

## 2022-04-12 ENCOUNTER — Other Ambulatory Visit: Payer: Self-pay | Admitting: Cardiology

## 2022-10-15 ENCOUNTER — Telehealth: Payer: Self-pay | Admitting: Cardiology

## 2022-10-15 MED ORDER — AMOXICILLIN 500 MG PO TABS: 2000.0000 mg | ORAL_TABLET | ORAL | 3 refills | Status: DC

## 2022-10-15 NOTE — Telephone Encounter (Signed)
*  STAT* If patient is at the pharmacy, call can be transferred to refill team.   1. Which medications need to be refilled? (please list name of each medication and dose if known) amoxicillin (AMOXIL) 500 MG tablet   2. Which pharmacy/location (including street and city if local pharmacy) is medication to be sent to? [facility] [place], [address]   3. Do they need a 30 day or 90 day supply?    Needs for dentist appt that is at 3:00 pm today

## 2022-10-15 NOTE — Telephone Encounter (Signed)
Rx refill sent to pharmacy. 

## 2022-12-31 ENCOUNTER — Other Ambulatory Visit: Payer: Self-pay | Admitting: Cardiology

## 2023-03-01 ENCOUNTER — Other Ambulatory Visit: Payer: Self-pay | Admitting: Cardiology

## 2023-04-22 ENCOUNTER — Ambulatory Visit: Payer: BC Managed Care – PPO | Attending: Cardiology | Admitting: Cardiology

## 2023-04-22 ENCOUNTER — Encounter: Payer: Self-pay | Admitting: Cardiology

## 2023-04-22 VITALS — BP 132/80 | HR 80 | Ht 74.0 in | Wt 278.4 lb

## 2023-04-22 DIAGNOSIS — E782 Mixed hyperlipidemia: Secondary | ICD-10-CM

## 2023-04-22 DIAGNOSIS — Z953 Presence of xenogenic heart valve: Secondary | ICD-10-CM

## 2023-04-22 DIAGNOSIS — I10 Essential (primary) hypertension: Secondary | ICD-10-CM | POA: Diagnosis not present

## 2023-04-22 MED ORDER — AMOXICILLIN 500 MG PO TABS: 2000.0000 mg | ORAL_TABLET | ORAL | 0 refills | Status: DC

## 2023-04-22 NOTE — Addendum Note (Signed)
Addended by: Roxanne Mins I on: 04/22/2023 04:02 PM   Modules accepted: Orders

## 2023-04-22 NOTE — Patient Instructions (Signed)

## 2023-04-22 NOTE — Progress Notes (Signed)
Cardiology Office Note:    Date:  04/22/2023   ID:  [name], [name] 15-Feb-1961, MRN [id number]  PCP:  [name], MD  Cardiologist:  Norman Herrlich, MD    Referring MD: [name], MD    ASSESSMENT:    1. S/P aortic valve replacement with bioprosthetic valve   2. Essential hypertension   3. Mixed hyperlipidemia    PLAN:    In order of problems listed above:  Overall he has done well following surgical intervention for aortic stenosis asymptomatic New York Heart Association class I at this time does not require imaging but will need an echocardiogram at the 10-year anniversary 2028 continue low-dose aspirin and antibiotic prophylaxis Well-controlled repeat blood pressure is 132/80 by me large cuff in the office continue his ARB Continue with statin   Next appointment: 1 year   Medication Adjustments/Labs and Tests Ordered: Current medicines are reviewed at length with the patient today.  Concerns regarding medicines are outlined above.  Orders Placed This Encounter  Procedures   EKG 12-Lead   No orders of the defined types were placed in this encounter.    History of Present Illness:    [name] is a 62 y.o. male with a hx of symptomatic aortic stenosis severe with surgical AVR bioprosthetic and ascending aortic graft at [facility] 08/13/2017 hyperlipidemia and hypertension last seen 03/12/2022. 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 Take 4 capsules 1 hour prior to dental appointment   Ascorbic Acid (VITAMIN C PO) Take 1 tablet by mouth daily.   aspirin EC 81 MG tablet Take 81 mg by mouth daily.   Cholecalciferol (VITAMIN D) 2000 units CAPS Take 2,000 Units by mouth daily.    Coenzyme Q10 (CO Q 10) 100 MG CAPS Take 1 capsule by mouth daily.   fluticasone (FLONASE) 50 MCG/ACT nasal spray Place 1 spray into both nostrils daily as needed for allergies or rhinitis.   loratadine (CLARITIN) 10 MG tablet Take 10 mg by mouth daily as needed for allergies.   losartan (COZAAR) 50 MG tablet TAKE 1 TABLET DAILY   montelukast (SINGULAIR) 10 MG tablet Take 10 mg by mouth daily as needed (allergies).    nitroGLYCERIN (NITROSTAT) 0.4 MG SL tablet Place 1 tablet (0.4 mg total) under the tongue every 5 (five) minutes as needed for chest pain.   Risankizumab-rzaa,150 MG Dose, (SKYRIZI, 150 MG DOSE,) 75 MG/0.83ML PSKT Inject 150 mg into the skin every 3 (three) months.   sildenafil (VIAGRA) 50 MG tablet Take 50 mg by mouth as needed for erectile dysfunction.   [DISCONTINUED] zinc gluconate 50 MG tablet Take 50 mg by  mouth daily.      EKGs/Labs/Other Studies Reviewed:    The following studies were reviewed today:  EKG Interpretation Date/Time:  Tuesday April 22 2023 15:12:07 EDT Ventricular Rate:  75 PR Interval:  198 QRS Duration:  76 QT Interval:  358 QTC Calculation: 399 R Axis:   0  Text Interpretation: Normal sinus rhythm Low voltage QRS Borderline ECG When compared with ECG of 09-Feb-2004 12:58, No significant change was found Confirmed by Norman Herrlich ([id number]) on 04/22/2023 3:28:51 PM   Recent Labs: No results found for requested labs within last 365  days.  Recent Lipid Panel    Component Value Date/Time   CHOL 168 03/12/2022 1519   TRIG 138 03/12/2022 1519   HDL 37 (L) 03/12/2022 1519   CHOLHDL 4.5 03/12/2022 1519   LDLCALC 106 (H) 03/12/2022 1519    Physical Exam:    VS:  BP (!) 142/92 (BP Location: Right Arm, Patient Position: Sitting)   Pulse 80   Ht 6\' 2"  (1.88 m)   Wt 278 lb 6.4 oz (126.3 kg)   SpO2 94%   BMI 35.74 kg/m     Wt Readings from Last 3 Encounters:  04/22/23 278 lb 6.4 oz (126.3 kg)  03/12/22 285 lb (129.3 kg)  03/16/21 283 lb 12.8 oz (128.7 kg)     GEN:  Well nourished, well developed in no acute distress HEENT: Normal NECK: No JVD; No carotid bruits LYMPHATICS: No lymphadenopathy CARDIAC: RRR, no murmurs, rubs, gallops RESPIRATORY:  Clear to auscultation without rales, wheezing or rhonchi  ABDOMEN: Soft, non-tender, non-distended MUSCULOSKELETAL:  No edema; No deformity  SKIN: Warm and dry NEUROLOGIC:  Alert and oriented x 3 PSYCHIATRIC:  Normal affect    Signed, Norman Herrlich, MD  04/22/2023 3:47 PM    [facility]

## 2023-04-30 ENCOUNTER — Other Ambulatory Visit: Payer: Self-pay | Admitting: Cardiology

## 2023-10-23 ENCOUNTER — Telehealth: Payer: Self-pay | Admitting: Cardiology

## 2023-10-23 MED ORDER — AMOXICILLIN 500 MG PO TABS: 2000.0000 mg | ORAL_TABLET | ORAL | 3 refills | Status: AC

## 2023-10-23 NOTE — Telephone Encounter (Signed)
 Pt c/o medication issue:  1. Name of Medication: amoxicillin (AMOXIL) 500 MG tablet   2. How are you currently taking this medication (dosage and times per day)? Take 4 tablets (2,000 mg total) by mouth as directed. Take 4 capsules 1 hour prior to dental appointment   3. Are you having a reaction (difficulty breathing--STAT)? No  4. What is your medication issue? Patient's wife called because the patient has a dental appointment today at 2:30 PM. Patient has one tablet left, but it expired on 10/15/23. Patient's wife would like to know if it is safe for the patient to take. [facility] is needing [facility]. Please advise.

## 2023-10-23 NOTE — Telephone Encounter (Signed)
 Misty Stanley per DPR aware that RX has been sent to [facility].

## 2023-10-26 ENCOUNTER — Other Ambulatory Visit: Payer: Self-pay | Admitting: Cardiology

## 2024-05-17 NOTE — Progress Notes (Unsigned)
 Cardiology Office Note:    Date:  05/18/2024   ID:  Zachary, Byrd [birth year], [name]  PCP:  Silver Lamar LABOR, MD  Cardiologist:  Redell Leiter, MD    Referring MD: Silver Lamar LABOR, MD    ASSESSMENT:    1. S/P aortic valve replacement with bioprosthetic valve   2. Essential hypertension   3. Mixed hyperlipidemia    PLAN:    In order of problems listed above:  [name] doing very well long-term following valve replacement.  He had the 5-year follow-up echocardiogram require another 10 years in 2028 continue low-dose aspirin  Blood pressure is well-controlled and currently taking no antihypertensive medications Good opportunity off medications to reassess his lipids along with an ApoB.   Next appointment: 1 year   Medication Adjustments/Labs and Tests Ordered: Current medicines are reviewed at length with the patient today.  Concerns regarding medicines are outlined above.  Orders Placed This Encounter  Procedures   EKG 12-Lead   No orders of the defined types were placed in this encounter.    History of Present Illness:    [name] is a 63 y.o. male with a hx of symptomatic aortic stenosis severe with surgical AVR bioprosthetic and ascending aortic graft at [facility] 08/13/2017 hyperlipidemia and hypertension last seen 04/22/2023. Compliance with diet, lifestyle and medications: Yes  He has embraced a healthy lifestyle integrative medicine has had a significant weight loss with the marked improvement in the quality of his life Symptoms of sleep apnea have resolved Home blood pressure consistently 120/80 or less Repeat blood pressure by me 126/80 He has had no cardiovascular symptoms chest pain shortness of breath palpitation or syncope He takes amoxicillin  endocarditis for dental Past Medical History:  Diagnosis Date   Anemia 04/03/2017   Ascending aortic aneurysm 05/29/2017   CTA chest/abdomen/pelvis dated 06/11/2017: ascending thoracic aorta  which  measures up to 4.2 cm in diameter     Asthma 04/03/2017   AV block, 1st degree 06/26/2017   Bicuspid aortic valve 04/06/2016   CHF (congestive heart failure), NYHA class III, chronic, diastolic (HCC) 06/18/2017   Diverticulosis 04/03/2017   Elevated liver enzymes 07/07/2017   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   Overview:  39 mm aortic root   Environmental and seasonal allergies 02/24/2020   Essential hypertension 04/06/2016   ETD (Eustachian tube dysfunction), right 02/13/2021   Hyperlipidemia 04/06/2016   Hypertrophy of inferior nasal turbinate 09/25/2021   Impacted cerumen of left ear 09/25/2021   Leukocytosis 06/24/2017   Malaise and fatigue 02/24/2020   Mild aortic stenosis 04/06/2016   Overview:  Mild at last FU 2015   Mild CAD 07/10/2018   Non obstructive 40% RCA August 2018 pre AVR     Nasal septal deviation 09/25/2021   Nephrolithiasis 04/13/2020   Obstructive sleep apnea 04/03/2017   Persistent insomnia 07/07/2017   Prostate cancer screening 07/07/2017   Psoriasis 04/03/2017   S/P aortic valve replacement with bioprosthetic valve 09/04/2017   bioprosthetic (27 mm Pericardial Ewards) AVR and ascending aorta graft on 10/29                 S/P ascending aortic replacement 06/24/2017   Sleep apnea    uses CPAP    Thoracic aortic aneurysm without rupture 07/07/2017   3.9 cm-  aortic root      Current Medications: Current Meds  Medication Sig   amoxicillin  (AMOXIL ) 500 MG tablet Take 4 tablets (2,000 mg total) by mouth as directed. Take 4  capsules 1 hour prior to dental appointment   Ascorbic Acid (VITAMIN C PO) Take 1 tablet by mouth daily.   aspirin  EC 81 MG tablet Take 81 mg by mouth daily.   Cholecalciferol (VITAMIN D ) 2000 units CAPS Take 2,000 Units by mouth daily.    Coenzyme Q10 (CO Q 10) 100 MG CAPS Take 1 capsule by mouth daily.   fluticasone (FLONASE) 50 MCG/ACT nasal spray Place 1 spray into both nostrils daily as needed for allergies or rhinitis.    loratadine (CLARITIN) 10 MG tablet Take 10 mg by mouth daily as needed for allergies.   nitroGLYCERIN  (NITROSTAT ) 0.4 MG SL tablet Place 1 tablet (0.4 mg total) under the tongue every 5 (five) minutes as needed for chest pain.   sildenafil (VIAGRA) 50 MG tablet Take 50 mg by mouth as needed for erectile dysfunction.      EKGs/Labs/Other Studies Reviewed:    The following studies were reviewed today:  Cardiac Studies & Procedures   ______________________________________________________________________________________________ CARDIAC CATHETERIZATION  CARDIAC CATHETERIZATION 04/24/2017  Conclusion  Moderately severe to severe aortic stenosis, peak to peak gradient 58 mmHg, mean gradient 43 mmHg. Heavily calcified aortic valve with decreased leaflet motion.  Right dominant coronary anatomy. 30-40% proximal RCA eccentric narrowing.  Normal left main .  Normal circumflex.  Normal ramus intermedius.  Normal right heart pressures.   RECOMMENDATIONS:   Consider referral to heart valve clinic for consideration of SAVR vs TAVR  Findings Coronary Findings Diagnostic  Dominance: Right  Right Coronary Artery The lesion is eccentric.  Intervention  No interventions have been documented.     ECHOCARDIOGRAM  ECHOCARDIOGRAM COMPLETE 03/05/2022  Narrative ECHOCARDIOGRAM REPORT    Patient Name:   [name] Date of Exam: 03/05/2022 Medical Rec #:  Byrd         Height:       72.0 in Accession #:    [id number]        Weight:       283.8 lb Date of Birth:  April 12, 1961         BSA:          2.472 m Patient Age:    61 years          BP:           140/80 mmHg Patient Gender: M                 HR:           75 bpm. Exam Location:  [place]  Procedure: 2D Echo, Cardiac Doppler, Color Doppler and Strain Analysis  Indications:    S/P aortic valve replacement with bioprosthetic valve [Z95.3 (ICD-10-CM)]  History:        Patient has prior history of Echocardiogram  examinations, most recent 09/13/2017. CAD and Enlarged thoracic aorta, Aortic stenosis, Signs/Symptoms:Enlarged thoracic aorta; Risk Factors:Hypertension. Aortic Valve: valve is present in the aortic position. Procedure Date: 08/13/2017 SAVR at [facility] REPLACEMENT, AORTIC VALVE, OPEN, WITH CARDIOPULMONARY BYPASS, STERNOTOMY; WITH PROSTHETIC VALVE OTHER THAN HOMOGRAFT OR STENTLESS VALVE.  Sonographer:    Lynwood Silvas RDCS Referring Phys: [id number] Angellee Cohill J Darilyn Storbeck  IMPRESSIONS   1. Left ventricular ejection fraction, by estimation, is 60 to 65%. The left ventricle has normal function. The left ventricle has no regional wall motion abnormalities. There is severe concentric left ventricular hypertrophy. Left ventricular diastolic parameters were normal. 2. Right ventricular systolic function is normal. The right ventricular size is normal. There is normal pulmonary artery systolic pressure. 3.  The mitral valve is normal in structure. No evidence of mitral valve regurgitation. No evidence of mitral stenosis. 4. Aortic valve regurgitation is not visualized. No aortic stenosis is present. There is a valve present in the aortic position. Procedure Date: 08/13/2017 SAVR at [facility] REPLACEMENT, AORTIC VALVE, OPEN, WITH CARDIOPULMONARY BYPASS, STERNOTOMY; WITH PROSTHETIC VALVE OTHER THAN HOMOGRAFT OR STENTLESS VALVE. Echo findings are consistent with normal structure and function of the aortic valve prosthesis. 5. Aortic Normal DTA. 6. The inferior vena cava is normal in size with greater than 50% respiratory variability, suggesting right atrial pressure of 3 mmHg.  FINDINGS Left Ventricle: Left ventricular ejection fraction, by estimation, is 60 to 65%. The left ventricle has normal function. The left ventricle has no regional wall motion abnormalities. Global longitudinal strain performed but not reported based on interpreter judgement due to suboptimal tracking. The left ventricular internal cavity size was  normal in size. There is severe concentric left ventricular hypertrophy. Left ventricular diastolic parameters were normal. Normal left ventricular filling pressure.  Right Ventricle: The right ventricular size is normal. No increase in right ventricular wall thickness. Right ventricular systolic function is normal. There is normal pulmonary artery systolic pressure. The tricuspid regurgitant velocity is 1.34 m/s, and with an assumed right atrial pressure of 3 mmHg, the estimated right ventricular systolic pressure is 10.2 mmHg.  Left Atrium: Left atrial size was normal in size.  Right Atrium: Right atrial size was normal in size.  Pericardium: There is no evidence of pericardial effusion.  Mitral Valve: The mitral valve is normal in structure. No evidence of mitral valve regurgitation. No evidence of mitral valve stenosis.  Tricuspid Valve: The tricuspid valve is normal in structure. Tricuspid valve regurgitation is not demonstrated. No evidence of tricuspid stenosis.  Aortic Valve: Aortic valve regurgitation is not visualized. No aortic stenosis is present. Aortic valve mean gradient measures 11.0 mmHg. Aortic valve peak gradient measures 19.0 mmHg. Aortic valve area, by VTI measures 1.82 cm. There is a valve present in the aortic position. Procedure Date: 08/13/2017 SAVR at [facility] REPLACEMENT, AORTIC VALVE, OPEN, WITH CARDIOPULMONARY BYPASS, STERNOTOMY; WITH PROSTHETIC VALVE OTHER THAN HOMOGRAFT OR STENTLESS VALVE. Echo findings are consistent with normal structure and function of the aortic valve prosthesis.  Pulmonic Valve: The pulmonic valve was not well visualized. Pulmonic valve regurgitation is not visualized. No evidence of pulmonic stenosis.  Aorta: The aortic root and ascending aorta are structurally normal, with no evidence of dilitation, the aortic arch was not well visualized and Normal DTA.  Venous: The pulmonary veins were not well visualized. The inferior vena cava is normal  in size with greater than 50% respiratory variability, suggesting right atrial pressure of 3 mmHg.  IAS/Shunts: No atrial level shunt detected by color flow Doppler.   LEFT VENTRICLE PLAX 2D LVIDd:         4.20 cm   Diastology LVIDs:         2.90 cm   LV e' medial:    8.68 cm/s LV PW:         1.80 cm   LV E/e' medial:  12.6 LV IVS:        1.80 cm   LV e' lateral:   9.32 cm/s LVOT diam:     2.10 cm   LV E/e' lateral: 11.7 LV SV:         83 LV SV Index:   33 LVOT Area:     3.46 cm   RIGHT VENTRICLE  IVC RV S prime:     9.00 cm/s  IVC diam: 1.90 cm TAPSE (M-mode): 2.1 cm  LEFT ATRIUM             Index        RIGHT ATRIUM           Index LA diam:        4.30 cm 1.74 cm/m   RA Area:     20.60 cm LA Vol (A2C):   69.0 ml 27.91 ml/m  RA Volume:   65.80 ml  26.62 ml/m LA Vol (A4C):   61.1 ml 24.71 ml/m LA Biplane Vol: 68.2 ml 27.59 ml/m AORTIC VALVE AV Area (Vmax):    1.72 cm AV Area (Vmean):   1.80 cm AV Area (VTI):     1.82 cm AV Vmax:           218.00 cm/s AV Vmean:          158.000 cm/s AV VTI:            0.454 m AV Peak Grad:      19.0 mmHg AV Mean Grad:      11.0 mmHg LVOT Vmax:         108.00 cm/s LVOT Vmean:        82.000 cm/s LVOT VTI:          0.239 m LVOT/AV VTI ratio: 0.53  AORTA Ao Root diam: 3.40 cm Ao Asc diam:  3.20 cm Ao Desc diam: 2.40 cm  MITRAL VALVE                TRICUSPID VALVE MV Area (PHT): 4.41 cm     TR Peak grad:   7.2 mmHg MV Decel Time: 172 msec     TR Vmax:        134.00 cm/s MV E velocity: 109.00 cm/s MV A velocity: 62.90 cm/s   SHUNTS MV E/A ratio:  1.73         Systemic VTI:  0.24 m Systemic Diam: 2.10 cm  Redell Leiter MD Electronically signed by Redell Leiter MD Signature Date/Time: 03/05/2022/5:13:02 PM    Final    MONITORS  CARDIAC EVENT MONITOR 06/08/2018  Narrative [name], [name], [name]  EVENT MONITOR REPORT:   Patient was monitored from 06/05/2018 11 9 to  2019. Indication:                    Palpitations Ordering physician:  Jennifer JONELLE Crape, MD Referring physician:  Jennifer JONELLE Crape, MD   Baseline rhythm: Sinus rhythm  Minimum heart rate: 50 BPM.  maximal heart rate 149 BPM.  Atrial arrhythmia: None significant  Ventricular arrhythmia: None significant  Conduction abnormality: None significant  Symptoms: Patient complained of chest pain at times   Conclusion: Uneventful and unremarkable monitoring.  Interpreting  cardiologist: Jennifer JONELLE Crape, MD Date: 07/07/2018 4:54 PM   CT SCANS  CT CORONARY MORPH W/CTA COR W/SCORE 06/11/2017  Addendum 06/11/2017  1:48 PM ADDENDUM REPORT: 06/11/2017 13:45  CLINICAL DATA:  63 year old male with severe aortic stenosis being evaluated for a rapid deployment aortic valve.  EXAM: Cardiac TAVR CT  TECHNIQUE: The patient was scanned on a [employer]. A 120 kV retrospective scan was triggered in the descending thoracic aorta at 111 HU's. Gantry rotation speed was 250 msecs and collimation was .6 mm. No beta blockade or nitro were given. The 3D data set was reconstructed in 5% intervals of the R-R cycle.  Systolic and diastolic phases were analyzed on a dedicated work station using MPR, MIP and VRT modes. The patient received 80 cc of contrast.  FINDINGS: Aortic Valve: Bicuspid, severely thickened and calcified with severely restricted leaflet opening.  Aorta: Aneurysmal dilatation of the ascending aorta, normal size of the aortic arch. No calcifications.  Sinotubular Junction:  37 x 37 mm  Ascending Thoracic Aorta:  44 x 43 mm  Aortic Arch:  27 x 24 mm  Descending Thoracic Aorta:  Not visualized  Sinus of Valsalva Measurements:  43 x 37 mm  Coronary Artery Height above Annulus:  Left Main:  15 mm  Right Coronary:  18 mm  Virtual Basal Annulus Measurements:  Maximum/Minimum Diameter:  33.6 x 28.5 mm  Average Diameter:  30.6 mm  Perimeter:  97.9  mm  Area:  737 mm2  Coronary Arteries:  Normal origin.  IMPRESSION: 1. Bicuspid aortic valve with severely thickened and calcified leaflets with severely restricted leaflet opening.  2. Dilated aortic root, sinotubular junction and ascending thoracic aorta with maximum diameter 44 mm.  3.  Normal size of the pulmonary artery.  4. Relatively small left atrial appendage with no evidence for a thrombus.  5.  No ASD/VSD.  Leim Moose   Electronically Signed By: Leim Moose On: 06/11/2017 13:45  Narrative EXAM: OVER-READ INTERPRETATION  CT CHEST  The following report is an over-read performed by radiologist Dr. Toribio Cove [facility] on 06/11/2017. This over-read does not include interpretation of cardiac or coronary anatomy or pathology. The coronary calcium score/coronary CTA interpretation by the cardiologist is attached.  COMPARISON:  None.  FINDINGS: Extracardiac findings will be described separately under dictation for contemporaneously obtained CTA of the chest, abdomen and pelvis.  IMPRESSION: 1. Please see separate dictation for contemporaneously obtained CTA of the chest, abdomen and pelvis 06/11/2017 for full description of relevant extracardiac findings.  Electronically Signed: By: Toribio Aye M.D. On: 06/11/2017 09:49     ______________________________________________________________________________________________      EKG Interpretation Date/Time:  Tuesday May 18 2024 16:25:55 EDT Ventricular Rate:  87 PR Interval:  202 QRS Duration:  72 QT Interval:  340 QTC Calculation: 409 R Axis:   28  Text Interpretation: Normal sinus rhythm Normal ECG When compared with ECG of 22-Apr-2023 15:12, No significant change was found Confirmed by Monetta Rogue ([id number]) on 05/18/2024 4:27:20 PM   Recent Labs: No results found for requested labs within last 365 days.  Recent Lipid Panel    Component Value Date/Time    CHOL 168 03/12/2022 1519   TRIG 138 03/12/2022 1519   HDL 37 (L) 03/12/2022 1519   CHOLHDL 4.5 03/12/2022 1519   LDLCALC 106 (H) 03/12/2022 1519    Physical Exam:    VS:  BP 126/80   Pulse 87   Ht 6' 1 (1.854 m)   Wt 230 lb (104.3 kg)   SpO2 97%   BMI 30.34 kg/m     Wt Readings from Last 3 Encounters:  05/18/24 230 lb (104.3 kg)  04/22/23 278 lb 6.4 oz (126.3 kg)  03/12/22 285 lb (129.3 kg)     GEN: Marked improvement in his appearance BMI just tipped the scale to overweight well nourished, well developed in no acute distress HEENT: Normal NECK: No JVD; No carotid bruits LYMPHATICS: No lymphadenopathy CARDIAC: No abnormal sounds or murmur RRR, no murmurs, rubs, gallops RESPIRATORY:  Clear to auscultation without rales, wheezing or rhonchi  ABDOMEN: Soft, non-tender, non-distended MUSCULOSKELETAL:  No edema; No  deformity  SKIN: Warm and dry NEUROLOGIC:  Alert and oriented x 3 PSYCHIATRIC:  Normal affect    Signed, Redell Leiter, MD  05/18/2024 4:59 PM    [facility]

## 2024-05-18 ENCOUNTER — Encounter: Payer: Self-pay | Admitting: Cardiology

## 2024-05-18 ENCOUNTER — Ambulatory Visit: Attending: Cardiology | Admitting: Cardiology

## 2024-05-18 VITALS — BP 126/80 | HR 87 | Ht 73.0 in | Wt 230.0 lb

## 2024-05-18 DIAGNOSIS — I10 Essential (primary) hypertension: Secondary | ICD-10-CM

## 2024-05-18 DIAGNOSIS — E782 Mixed hyperlipidemia: Secondary | ICD-10-CM | POA: Diagnosis not present

## 2024-05-18 DIAGNOSIS — Z953 Presence of xenogenic heart valve: Secondary | ICD-10-CM

## 2024-05-18 NOTE — Patient Instructions (Signed)
 Medication Instructions:  Your physician recommends that you continue on your current medications as directed. Please refer to the Current Medication list given to you today.  *If you need a refill on your cardiac medications before your next appointment, please call your pharmacy*  Lab Work: Your physician recommends that you return for lab work in:   Labs today: Vitamin D , CMP, TSH, CBC, Lipids, Apo B  If you have labs (blood work) drawn today and your tests are completely normal, you will receive your results only by: MyChart Message (if you have MyChart) OR A paper copy in the mail If you have any lab test that is abnormal or we need to change your treatment, we will call you to review the results.  Testing/Procedures: None  Follow-Up: At [facility], you and your health needs are our priority.  As part of our continuing mission to provide you with exceptional heart care, our providers are all part of one team.  This team includes your primary Cardiologist (physician) and Advanced Practice Providers or APPs (Physician Assistants and Nurse Practitioners) who all work together to provide you with the care you need, when you need it.  Your next appointment:   1 year(s)  Provider:   Redell Leiter, MD    We recommend signing up for the patient portal called MyChart.  Sign up information is provided on this After Visit Summary.  MyChart is used to connect with patients for Virtual Visits (Telemedicine).  Patients are able to view lab/test results, encounter notes, upcoming appointments, etc.  Non-urgent messages can be sent to your provider as well.   To learn more about what you can do with MyChart, go to [email or web address].   Other Instructions None

## 2024-05-21 ENCOUNTER — Ambulatory Visit: Payer: Self-pay | Admitting: Cardiology

## 2024-05-21 LAB — COMPREHENSIVE METABOLIC PANEL WITH GFR
ALT: 17 IU/L (ref 0–44)
AST: 15 IU/L (ref 0–40)
Albumin: 4.5 g/dL (ref 3.9–4.9)
Alkaline Phosphatase: 49 IU/L (ref 47–123)
BUN/Creatinine Ratio: 19 (ref 10–24)
BUN: 22 mg/dL (ref 8–27)
Bilirubin Total: 0.8 mg/dL (ref 0.0–1.2)
CO2: 23 mmol/L (ref 20–29)
Calcium: 9.8 mg/dL (ref 8.6–10.2)
Chloride: 102 mmol/L (ref 96–106)
Creatinine, Ser: 1.16 mg/dL (ref 0.76–1.27)
Globulin, Total: 2.4 g/dL (ref 1.5–4.5)
Glucose: 75 mg/dL (ref 70–99)
Potassium: 4.4 mmol/L (ref 3.5–5.2)
Sodium: 141 mmol/L (ref 134–144)
Total Protein: 6.9 g/dL (ref 6.0–8.5)
eGFR: 72 mL/min/1.73 (ref >59)

## 2024-05-21 LAB — CBC
Hematocrit: 48.3 % (ref 37.5–51.0)
Hemoglobin: 16 g/dL (ref 13.0–17.7)
MCH: 31.9 pg (ref 26.6–33.0)
MCHC: 33.1 g/dL (ref 31.5–35.7)
MCV: 96 fL (ref 79–97)
Platelets: 174 x10E3/uL (ref 150–450)
RBC: 5.02 x10E6/uL (ref 4.14–5.80)
RDW: 14.1 % (ref 11.6–15.4)
WBC: 5.6 x10E3/uL (ref 3.4–10.8)

## 2024-05-21 LAB — LIPID PANEL
Chol/HDL Ratio: 3.7 ratio (ref 0.0–5.0)
Cholesterol, Total: 165 mg/dL (ref 100–199)
HDL: 45 mg/dL (ref >39)
LDL Chol Calc (NIH): 110 mg/dL — ABNORMAL HIGH (ref 0–99)
Triglycerides: 50 mg/dL (ref 0–149)
VLDL Cholesterol Cal: 10 mg/dL (ref 5–40)

## 2024-05-21 LAB — VITAMIN D 25 HYDROXY (VIT D DEFICIENCY, FRACTURES): Vit D, 25-Hydroxy: 86.9 ng/mL (ref 30.0–100.0)

## 2024-05-21 LAB — APOLIPOPROTEIN B: Apolipoprotein B: 86 mg/dL (ref <90)

## 2024-05-21 LAB — TSH: TSH: 2.09 u[IU]/mL (ref 0.450–4.500)

## 2024-05-25 ENCOUNTER — Other Ambulatory Visit: Payer: Self-pay

## 2024-05-25 DIAGNOSIS — E782 Mixed hyperlipidemia: Secondary | ICD-10-CM

## 2024-05-25 MED ORDER — PRAVASTATIN SODIUM 20 MG PO TABS: 20.0000 mg | ORAL_TABLET | Freq: Every evening | ORAL | 3 refills | Status: AC

## 2024-05-26 ENCOUNTER — Encounter: Payer: Self-pay | Admitting: Cardiology
